# Patient Record
Sex: Female | Born: 1950 | Race: White | Hispanic: No | Marital: Single | Smoking: Never smoker
Health system: Southern US, Community
[De-identification: ages and names within clinical notes are randomized; demographics above are authoritative.]

## PROBLEM LIST (undated history)

## (undated) DIAGNOSIS — T4145XA Adverse effect of unspecified anesthetic, initial encounter: Secondary | ICD-10-CM

## (undated) DIAGNOSIS — R112 Nausea with vomiting, unspecified: Secondary | ICD-10-CM

## (undated) DIAGNOSIS — R9389 Abnormal findings on diagnostic imaging of other specified body structures: Secondary | ICD-10-CM

## (undated) DIAGNOSIS — Z9889 Other specified postprocedural states: Secondary | ICD-10-CM

## (undated) DIAGNOSIS — J189 Pneumonia, unspecified organism: Secondary | ICD-10-CM

## (undated) DIAGNOSIS — I219 Acute myocardial infarction, unspecified: Secondary | ICD-10-CM

## (undated) DIAGNOSIS — E785 Hyperlipidemia, unspecified: Secondary | ICD-10-CM

## (undated) DIAGNOSIS — T8859XA Other complications of anesthesia, initial encounter: Secondary | ICD-10-CM

## (undated) HISTORY — DX: Acute myocardial infarction, unspecified: I21.9

## (undated) HISTORY — PX: CARDIAC CATHETERIZATION: SHX172

## (undated) HISTORY — DX: Hyperlipidemia, unspecified: E78.5

---

## 2005-08-10 HISTORY — PX: TOTAL KNEE ARTHROPLASTY: SHX125

## 2005-11-17 ENCOUNTER — Inpatient Hospital Stay (HOSPITAL_COMMUNITY): Admission: RE | Admit: 2005-11-17 | Discharge: 2005-11-21 | Payer: Self-pay | Admitting: Specialist

## 2010-08-10 HISTORY — PX: OTHER SURGICAL HISTORY: SHX169

## 2011-10-27 ENCOUNTER — Institutional Professional Consult (permissible substitution): Payer: Self-pay | Admitting: Pulmonary Disease

## 2011-10-30 ENCOUNTER — Ambulatory Visit (INDEPENDENT_AMBULATORY_CARE_PROVIDER_SITE_OTHER): Payer: Commercial Managed Care - PPO | Admitting: Pulmonary Disease

## 2011-10-30 ENCOUNTER — Encounter: Payer: Self-pay | Admitting: Pulmonary Disease

## 2011-10-30 VITALS — BP 150/98 | HR 92 | Temp 98.0°F | Ht 67.0 in | Wt 195.6 lb

## 2011-10-30 DIAGNOSIS — J8409 Other alveolar and parieto-alveolar conditions: Secondary | ICD-10-CM

## 2011-10-30 DIAGNOSIS — R918 Other nonspecific abnormal finding of lung field: Secondary | ICD-10-CM

## 2011-10-30 NOTE — Patient Instructions (Addendum)
Will set up for bronchoscopy next week to evaluate your abnormal xray.   No aspirin within 72 hrs of procedure. Will arrange followup once results are available.

## 2011-10-30 NOTE — Assessment & Plan Note (Signed)
The patient has bilateral patchy infiltrates with air bronchograms of unknown origin.  Surprisingly, she really has no significant pulmonary symptoms, and feels well.  I have discussed the various possibilities with her, including atypical infections, inflammatory lung diseases, and also in the very remote possibility of cancer.  I suspect this is some type of inflammatory process such as eosinophilic pneumonia, hypersensitivity pneumonitis, or possibly bronchiolitis obliterans with organizing pneumonia.  I have explained to her that she needs bronchoscopy in order to obtain a diagnosis, and she is agreeable.  I discussed the risks of respiratory distress, bleeding, as well as pneumothorax.

## 2011-10-30 NOTE — Progress Notes (Signed)
  Subjective:    Patient ID: Chelsea Thornton, female    DOB: 1950/10/04, 61 y.o.   MRN: [id number]  HPI The patient is a 61 year old female who been asked to see for an abnormal CT chest.  The patient had a myocardial infarction in November of last year, and was found to have an abnormal chest x-ray suggestive of pneumonia.  She was treated with antibiotics, but had no change in her infiltrates.  The patient was very hesitant to have any further workup but ultimately agreed to a CT chest recently.  She was found to have bilateral patchy groundglass densities of unknown origin.  There was no lymphadenopathy noted.  Surprisingly, the patient denies any significant cough or chest congestion.  She does on rare occasions produces scant pale yellow mucous.  She denies any shortness of breath and feels that her exertional tolerance is at baseline.  She is eating well and has not been losing weight.  The patient denies any history of TB exposure, but has never had a PPD placed.  She has no personal history of cancer.  He denies any unusual hobbies, travel, or pets.  She has not been around fowl.     Review of Systems  Constitutional: Negative for fever and unexpected weight change.  HENT: Negative for ear pain, nosebleeds, congestion, sore throat, rhinorrhea, sneezing, trouble swallowing, dental problem, postnasal drip and sinus pressure.   Eyes: Negative for redness and itching.  Respiratory: Negative for cough, chest tightness, shortness of breath and wheezing.   Cardiovascular: Negative for palpitations and leg swelling.  Gastrointestinal: Negative for nausea and vomiting.  Genitourinary: Negative for dysuria.  Musculoskeletal: Negative for joint swelling.  Skin: Negative for rash.  Neurological: Negative for headaches.  Hematological: Does not bruise/bleed easily.  Psychiatric/Behavioral: Negative for dysphoric mood. The patient is not nervous/anxious.        Objective:   Physical  Exam Constitutional:  Well developed, no acute distress  HENT:  Nares patent without discharge  Oropharynx without exudate, palate and uvula are normal  Eyes:  Perrla, eomi, no scleral icterus  Neck:  No JVD, no TMG  Cardiovascular:  Normal rate, regular rhythm, no rubs or gallops.  No murmurs        Intact distal pulses  Pulmonary :  Normal breath sounds, no stridor or respiratory distress   No rales, rhonchi, or wheezing  Abdominal:  Soft, nondistended, bowel sounds present.  No tenderness noted.   Musculoskeletal:  1+ lower extremity edema noted.  Lymph Nodes:  No cervical lymphadenopathy noted  Skin:  No cyanosis noted  Neurologic:  Alert, appropriate, moves all 4 extremities without obvious deficit.         Assessment & Plan:

## 2011-11-03 ENCOUNTER — Ambulatory Visit (HOSPITAL_COMMUNITY): Payer: Commercial Managed Care - PPO

## 2011-11-03 ENCOUNTER — Ambulatory Visit (HOSPITAL_COMMUNITY)
Admission: RE | Admit: 2011-11-03 | Discharge: 2011-11-03 | Disposition: A | Discharge status: Home Health | Payer: Commercial Managed Care - PPO | Source: Ambulatory Visit | Attending: Pulmonary Disease | Admitting: Pulmonary Disease

## 2011-11-03 ENCOUNTER — Encounter (HOSPITAL_COMMUNITY)
Admission: RE | Disposition: A | Discharge status: Home Health | Payer: Self-pay | Source: Ambulatory Visit | Attending: Pulmonary Disease

## 2011-11-03 ENCOUNTER — Ambulatory Visit (HOSPITAL_COMMUNITY)
Admission: RE | Admit: 2011-11-03 | Discharge: 2011-11-03 | Disposition: A | Discharge status: Home / Self Care | Payer: Commercial Managed Care - PPO | Source: Ambulatory Visit | Attending: Pulmonary Disease | Admitting: Pulmonary Disease

## 2011-11-03 ENCOUNTER — Encounter (HOSPITAL_COMMUNITY): Payer: Self-pay

## 2011-11-03 DIAGNOSIS — R918 Other nonspecific abnormal finding of lung field: Secondary | ICD-10-CM | POA: Insufficient documentation

## 2011-11-03 DIAGNOSIS — I252 Old myocardial infarction: Secondary | ICD-10-CM | POA: Insufficient documentation

## 2011-11-03 HISTORY — PX: VIDEO BRONCHOSCOPY: SHX5072

## 2011-11-03 LAB — BODY FLUID CELL COUNT WITH DIFFERENTIAL: Monocyte-Macrophage-Serous Fluid: 0 % — ABNORMAL LOW (ref 50–90)

## 2011-11-03 SURGERY — VIDEO BRONCHOSCOPY WITHOUT FLUORO
Anesthesia: Moderate Sedation | Laterality: Bilateral

## 2011-11-03 MED ORDER — SODIUM CHLORIDE 0.9 % IV SOLN
INTRAVENOUS | Status: DC
  Administered 2011-11-03: 20 mL/h via INTRAVENOUS

## 2011-11-03 MED ORDER — LIDOCAINE HCL 2 % EX GEL
CUTANEOUS | Status: DC | PRN
  Administered 2011-11-03: 1

## 2011-11-03 MED ORDER — MIDAZOLAM HCL 10 MG/2ML IJ SOLN
INTRAMUSCULAR | Status: DC | PRN
  Administered 2011-11-03: 5 mg via INTRAVENOUS

## 2011-11-03 MED ORDER — MIDAZOLAM HCL 10 MG/2ML IJ SOLN
INTRAMUSCULAR | Status: AC
  Filled 2011-11-03: qty 4

## 2011-11-03 MED ORDER — MEPERIDINE HCL 25 MG/ML IJ SOLN
INTRAMUSCULAR | Status: DC | PRN
  Administered 2011-11-03: 50 mg via INTRAVENOUS

## 2011-11-03 MED ORDER — LIDOCAINE HCL 1 % IJ SOLN
INTRAMUSCULAR | Status: DC | PRN
  Administered 2011-11-03: 6 mL via RESPIRATORY_TRACT

## 2011-11-03 MED ORDER — PHENYLEPHRINE HCL 0.5 % NA SOLN
NASAL | Status: DC | PRN
  Administered 2011-11-03: 2 [drp] via NASAL

## 2011-11-03 MED ORDER — MEPERIDINE HCL 100 MG/ML IJ SOLN
INTRAMUSCULAR | Status: AC
  Filled 2011-11-03: qty 2

## 2011-11-03 NOTE — Discharge Instructions (Signed)
Bronchoscopy Care After These instructions give you information on caring for yourself after your procedure. Your doctor may also give you specific instructions. Call your doctor if you have any problems or questions after your procedure. HOME CARE  Do not eat or drink anything for 2 hours after your test. Your nose and throat was numbed by medicine. If you try to eat or drink before the medicine wears off, food or drink could go into your lungs.   For the rest of the first day, eat soft food and drink liquids slowly.   On the day after the test, you can go back to eating your usual food.   Do not drive or sign important papers the day of the test.   Take it easy for the next 2 days. Do not do any heavy work, exercise, or activities.   Only take medicine as told by your doctor. Do not take aspirin.   You may be drowsy for the next 24 hours.   You may see traces of blood in your spit for 1 to 2 days.  Finding out the results of your test Ask when your test results will be ready. Make sure you get your test results. GET HELP RIGHT AWAY IF:  You have breathing problems.   You have a bad sore throat for more than 1 week.   You see traces of blood in your spit for more than 3 days.   You start coughing up blood.   You have a temperature of 102 F (38.9 C) or higher.  MAKE SURE YOU:  Understand these instructions.   Will watch your condition.   Will get help right away if you are not doing well or get worse.  Document Released: 05/24/2009 Document Revised: 07/16/2011 Document Reviewed: 05/24/2009 [employer] Patient Information 2012 [employer]. 

## 2011-11-03 NOTE — Op Note (Signed)
Dictation # [id number]

## 2011-11-03 NOTE — Progress Notes (Signed)
Video bronchoscopy intervention with biopsy and BAL

## 2011-11-04 ENCOUNTER — Encounter (HOSPITAL_COMMUNITY): Payer: Self-pay | Admitting: Pulmonary Disease

## 2011-11-04 ENCOUNTER — Telehealth: Payer: Self-pay | Admitting: Pulmonary Disease

## 2011-11-04 NOTE — Telephone Encounter (Signed)
lmomtcb x1 

## 2011-11-04 NOTE — Op Note (Signed)
NAMEJANACE, Chelsea Thornton             ACCOUNT NO.:  [id number]  MEDICAL RECORD NO.:  [id number]  LOCATION:  RESP                         FACILITY[facility]  [facility]  PHYSICIAN:  Barbaraann Share, MD,FCCPDATE OF BIRTH:  07-02-1952  DATE OF PROCEDURE:  11/03/2011 DATE OF DISCHARGE:  11/03/2011                              OPERATIVE REPORT   PROCEDURE:  Video bronchoscopy with bronchoalveolar lavage and transbronchial lung biopsies.  OPERATOR:  Barbaraann Share, MD,FCCP.  INDICATION FOR THE PROCEDURE:  Bilateral pulmonary infiltrates of unknown origin.  ANESTHESIA:  Versed 5 mg IV, Demerol 50 mg IV, and topical 1% lidocaine to the vocal cords and airways during the procedure.  DESCRIPTION:  After obtaining informed consent and under close cardiopulmonary monitoring, the above preop anesthesia was given and the fiberoptic scope was passed to the right naris and into the posterior pharynx where there were no lesions or other abnormalities seen.  Vocal cords appeared to be within normal limits and moved bilaterally on phonation.  Scope was then passed into the trachea where it was examined along its entire length down to the level of the carina, all of which was normal.  The left and right tracheobronchial trees were examined to the subsegmental level with no endobronchial abnormality being found. Bronchoalveolar lavage was then done from the superior segment at the left lower lobe, lingula as well as the right lower lobe with good specimens being obtained.  Transbronchial lung biopsies were then done under fluoroscopic guidance from the basilar segments of the right lower lobe with excellent material being obtained.  Overall, the patient tolerated the procedure well and there were no complications.  Chest x- ray is pending at time of dictation to rule out pneumothorax post- transbronchial lung biopsy.     Barbaraann Share, MD,FCCP     [facility]/MED[facility]  D:  11/03/2011  T:  11/04/2011  Job:  [id number]

## 2011-11-04 NOTE — Interval H&P Note (Signed)
History and Physical Interval Note:  11/04/2011 8:52 AM  Chelsea Thornton  has presented today for surgery, with the diagnosis of Pulmonary infiltrate  The various methods of treatment have been discussed with the patient and family. After consideration of risks, benefits and other options for treatment, the patient has consented to  Procedure(s) (LRB): VIDEO BRONCHOSCOPY WITHOUT FLUORO (Bilateral) as a surgical intervention .  The patients' history has been reviewed, patient examined, no change in status, stable for surgery.  I have reviewed the patients' chart and labs.  Questions were answered to the patient's satisfaction.     Barbaraann Share

## 2011-11-04 NOTE — H&P (View-Only) (Signed)
  Subjective:    Patient ID: Chelsea Thornton, female    DOB: 03/15/1951, 61 y.o.   MRN: [id number]  HPI The patient is a 61-year-old female who been asked to see for an abnormal CT chest.  The patient had a myocardial infarction in November of last year, and was found to have an abnormal chest x-ray suggestive of pneumonia.  She was treated with antibiotics, but had no change in her infiltrates.  The patient was very hesitant to have any further workup but ultimately agreed to a CT chest recently.  She was found to have bilateral patchy groundglass densities of unknown origin.  There was no lymphadenopathy noted.  Surprisingly, the patient denies any significant cough or chest congestion.  She does on rare occasions produces scant pale yellow mucous.  She denies any shortness of breath and feels that her exertional tolerance is at baseline.  She is eating well and has not been losing weight.  The patient denies any history of TB exposure, but has never had a PPD placed.  She has no personal history of cancer.  He denies any unusual hobbies, travel, or pets.  She has not been around fowl.     Review of Systems  Constitutional: Negative for fever and unexpected weight change.  HENT: Negative for ear pain, nosebleeds, congestion, sore throat, rhinorrhea, sneezing, trouble swallowing, dental problem, postnasal drip and sinus pressure.   Eyes: Negative for redness and itching.  Respiratory: Negative for cough, chest tightness, shortness of breath and wheezing.   Cardiovascular: Negative for palpitations and leg swelling.  Gastrointestinal: Negative for nausea and vomiting.  Genitourinary: Negative for dysuria.  Musculoskeletal: Negative for joint swelling.  Skin: Negative for rash.  Neurological: Negative for headaches.  Hematological: Does not bruise/bleed easily.  Psychiatric/Behavioral: Negative for dysphoric mood. The patient is not nervous/anxious.        Objective:   Physical  Exam Constitutional:  Well developed, no acute distress  HENT:  Nares patent without discharge  Oropharynx without exudate, palate and uvula are normal  Eyes:  Perrla, eomi, no scleral icterus  Neck:  No JVD, no TMG  Cardiovascular:  Normal rate, regular rhythm, no rubs or gallops.  No murmurs        Intact distal pulses  Pulmonary :  Normal breath sounds, no stridor or respiratory distress   No rales, rhonchi, or wheezing  Abdominal:  Soft, nondistended, bowel sounds present.  No tenderness noted.   Musculoskeletal:  1+ lower extremity edema noted.  Lymph Nodes:  No cervical lymphadenopathy noted  Skin:  No cyanosis noted  Neurologic:  Alert, appropriate, moves all 4 extremities without obvious deficit.         Assessment & Plan:   

## 2011-11-04 NOTE — Telephone Encounter (Signed)
Spoke with pt and she states wants results of bronch. I advised they are not back yet, she just had test done yesterday. I advised per her pt instructions, that we will call her to arrange followup to discuss results once they are available. Pt verbalized understanding and states nothing further needed.

## 2011-11-05 ENCOUNTER — Telehealth: Payer: Self-pay | Admitting: Pulmonary Disease

## 2011-11-05 NOTE — Telephone Encounter (Signed)
ATC work # NA. I called house # and had to [facility]

## 2011-11-06 LAB — CULTURE, BAL-QUANTITATIVE W GRAM STAIN

## 2011-11-09 ENCOUNTER — Other Ambulatory Visit: Payer: Self-pay | Admitting: Pulmonary Disease

## 2011-11-09 DIAGNOSIS — J8409 Other alveolar and parieto-alveolar conditions: Secondary | ICD-10-CM

## 2011-11-09 LAB — LEGIONELLA CULTURE: Special Requests: NORMAL

## 2011-11-09 NOTE — Telephone Encounter (Signed)
Called, spoke with pt.  Would like to know if any results are back from bronch yet.  Per pt, her sister was told the latest she would get a call back regarding this would be today.  Dr. Shelle Iron, pls advise.  Thank you.

## 2011-11-09 NOTE — Telephone Encounter (Signed)
Tammy, these results DID NOT COME TO MY RESULTS BOX to be reviewed.  That includes path, cell count with differential., and others.  The only thing I saw last week was the prelim culture came to my box.

## 2011-11-10 ENCOUNTER — Telehealth: Payer: Self-pay | Admitting: Pulmonary Disease

## 2011-11-10 NOTE — Telephone Encounter (Signed)
Called and spoke with pt. Pt scheduled to see KC this thurs 4/4 at 10:15 am

## 2011-11-10 NOTE — Telephone Encounter (Signed)
I spoke with pt and she is requesting to see [facility] before she see's the surgeon. She states she just wants to discuss some things with him first. Please advise KC, thanks

## 2011-11-10 NOTE — Telephone Encounter (Signed)
Needs ov with me this week to discuss biopsy results.

## 2011-11-11 NOTE — Telephone Encounter (Signed)
PT has appt with Dr Shelle Iron on 11/12/11 to review biopsy results.

## 2011-11-12 ENCOUNTER — Telehealth: Payer: Self-pay | Admitting: Pulmonary Disease

## 2011-11-12 ENCOUNTER — Encounter: Payer: Commercial Managed Care - PPO | Admitting: Thoracic Surgery (Cardiothoracic Vascular Surgery)

## 2011-11-12 ENCOUNTER — Ambulatory Visit (INDEPENDENT_AMBULATORY_CARE_PROVIDER_SITE_OTHER): Payer: Commercial Managed Care - PPO | Admitting: Pulmonary Disease

## 2011-11-12 ENCOUNTER — Encounter: Payer: Self-pay | Admitting: Pulmonary Disease

## 2011-11-12 ENCOUNTER — Other Ambulatory Visit (INDEPENDENT_AMBULATORY_CARE_PROVIDER_SITE_OTHER): Payer: Commercial Managed Care - PPO

## 2011-11-12 VITALS — BP 176/100 | HR 90 | Temp 97.8°F | Ht 67.0 in | Wt 191.0 lb

## 2011-11-12 DIAGNOSIS — J8409 Other alveolar and parieto-alveolar conditions: Secondary | ICD-10-CM

## 2011-11-12 MED ORDER — PREDNISONE 20 MG PO TABS: ORAL_TABLET | ORAL | Status: DC

## 2011-11-12 NOTE — Assessment & Plan Note (Signed)
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  It is unclear whether this is an inflammatory process in the lung, or whether it may represent a parenchymal lymphoma?  Transbronchial lung biopsies are typically too small to make the diagnosis of lymphoma.  I have had a long discussion with the patient about her biopsy findings, and have strongly recommended a thoracoscopic biopsy for more definitive diagnosis.  The patient does not want to have a biopsy at this time.  One alternative plan would be to treat her with a course of prednisone and to see if she has significant improvement.  Unfortunately, many lymphomas can partially respond to steroids as well.  The patient is willing to have a lung biopsy if her infiltrates do not improve or if they only partially respond.  She understands the risk of treating her empirically with steroids.  I would like to check an autoimmune panel in the event this may represent BOOP.

## 2011-11-12 NOTE — Progress Notes (Signed)
  Subjective:    Patient ID: Chelsea Thornton, female    DOB: Jul 03, 1951, 61 y.o.   MRN: [id number]  HPI Patient comes in today for followup after her recent bronchoscopy for bilateral pulmonary infiltrates.  All of her cultures were unremarkable, as was her cell count and differential.  Her transbronchial lung biopsy showed prominent chronic lymphoplasmacytic inflammation with interstitial edema and abundant histiocytes.  There was no evidence for malignancy.  Special stains were unremarkable for yeast and fungi.  I have reviewed the biopsy results with her in detail, and cancer all of her questions.   Review of Systems  Constitutional: Negative for fever and unexpected weight change.  HENT: Negative for ear pain, nosebleeds, congestion, sore throat, rhinorrhea, sneezing, trouble swallowing, dental problem, postnasal drip and sinus pressure.   Eyes: Negative for redness and itching.  Respiratory: Negative for cough, chest tightness, shortness of breath and wheezing.   Cardiovascular: Negative for palpitations and leg swelling.  Gastrointestinal: Negative for nausea and vomiting.  Genitourinary: Negative for dysuria.  Musculoskeletal: Negative for joint swelling.  Skin: Negative for rash.  Neurological: Negative for headaches.  Hematological: Does not bruise/bleed easily.  Psychiatric/Behavioral: Negative for dysphoric mood. The patient is not nervous/anxious.        Objective:   Physical Exam Well-developed female in no acute distress Nose without purulence or discharge noted Chest with a few scattered crackles, no wheezes Cardiac exam is regular rate and rhythm Lower extremities without edema, no cyanosis Alert and oriented, moves all 4 extremities.       Assessment & Plan:

## 2011-11-12 NOTE — Telephone Encounter (Signed)
I spoke with pt and she stated that [facility] never received her rx for her prednisone. I advised it shows it was printed off and she stated she was never given rx. I have resent rx into the pharmacy and nothing further was needed

## 2011-11-12 NOTE — Patient Instructions (Signed)
Will check bloodwork today to evaluate for autoimmune diseases Will start on prednisone, and see you back in 2mos after a followup ct chest. Please call me if you change your mind and would like to consider surgery.

## 2011-11-13 ENCOUNTER — Telehealth: Payer: Self-pay | Admitting: Pulmonary Disease

## 2011-11-13 LAB — RHEUMATOID FACTOR: Rhuematoid fact SerPl-aCnc: <10 IU/mL (ref <=14)

## 2011-11-13 LAB — ANA: Anti Nuclear Antibody(ANA): POSITIVE — AB

## 2011-11-13 LAB — ANTI-NUCLEAR AB-TITER (ANA TITER): ANA Titer 1: NEGATIVE

## 2011-11-13 NOTE — Telephone Encounter (Signed)
I spoke with pt and she stated that the pharmacists had advised her that she could have some side effects from the prednisone and then their is a chance she wouldn't have any. I advised her this was correct and she voiced her understanding and needed nothing further

## 2011-11-13 NOTE — Telephone Encounter (Signed)
lmomtcb x1 for pt 

## 2011-11-13 NOTE — Telephone Encounter (Signed)
Pt returned call. Chelsea Thornton  

## 2011-11-17 ENCOUNTER — Telehealth: Payer: Self-pay | Admitting: Pulmonary Disease

## 2011-11-17 NOTE — Telephone Encounter (Signed)
Pt advised of results per lab append. Carron Curie, CMA

## 2011-12-01 LAB — FUNGUS CULTURE W SMEAR
Fungal Smear: NONE SEEN
Special Requests: NORMAL

## 2011-12-17 LAB — AFB CULTURE WITH SMEAR (NOT AT ARMC)
Acid Fast Smear: NONE SEEN
Special Requests: NORMAL

## 2012-01-15 ENCOUNTER — Ambulatory Visit (INDEPENDENT_AMBULATORY_CARE_PROVIDER_SITE_OTHER)
Admission: RE | Admit: 2012-01-15 | Discharge: 2012-01-15 | Disposition: A | Discharge status: Home / Self Care | Payer: Commercial Managed Care - PPO | Source: Ambulatory Visit | Attending: Pulmonary Disease | Admitting: Pulmonary Disease

## 2012-01-15 DIAGNOSIS — J8409 Other alveolar and parieto-alveolar conditions: Secondary | ICD-10-CM

## 2012-01-18 ENCOUNTER — Ambulatory Visit: Payer: Commercial Managed Care - PPO | Admitting: Pulmonary Disease

## 2012-01-19 ENCOUNTER — Encounter: Payer: Self-pay | Admitting: Pulmonary Disease

## 2012-01-19 ENCOUNTER — Ambulatory Visit (INDEPENDENT_AMBULATORY_CARE_PROVIDER_SITE_OTHER): Payer: Commercial Managed Care - PPO | Admitting: Pulmonary Disease

## 2012-01-19 VITALS — BP 124/80 | HR 84 | Temp 97.7°F | Ht 66.0 in | Wt 201.2 lb

## 2012-01-19 DIAGNOSIS — J8409 Other alveolar and parieto-alveolar conditions: Secondary | ICD-10-CM

## 2012-01-19 MED ORDER — PREDNISONE 10 MG PO TABS: ORAL_TABLET | ORAL | Status: DC

## 2012-01-19 NOTE — Assessment & Plan Note (Signed)
The patient has had a significant response both radiographically and symptomatically on the prednisone pulse.  However, she has not completely cleared her infiltrates, and I am concerned they will worsen again off prednisone.  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  I still feel that she needs a thoracoscopic lung biopsy, but she would like to give this more time to see if she responds further to steroids.  I am willing to give her another month, and then discontinue the prednisone.  If she has worsening of her infiltrates off the medication, she is agreeable to having a lung biopsy.

## 2012-01-19 NOTE — Patient Instructions (Signed)
Will treat with prednisone for the next 4 weeks.  If your abnormalities return or fail to clear, will send for lung biopsy. Take prednisone 20mg  each day for one week, then take 15mg  each day for one week, then take 10mg  each day for one week, then take 5mg  each day for one week, then stop. followup with me in 3 mos with cxr same day, but call me if your pulmonary symptoms worsen off prednisone.

## 2012-01-19 NOTE — Progress Notes (Signed)
  Subjective:    Patient ID: Chelsea Thornton, female    DOB: 1951-06-26, 61 y.o.   MRN: [id number]  HPI The patient comes in today for followup of her pulmonary infiltrates of unknown origin.  She was started on a course of prednisone at the last visit, and did not want to go for thoracoscopic biopsy.  She has had blood work done which is unremarkable except for an elevated sedimentation rate in the moderate range.  Her rheumatoid factor and ANA were all unremarkable.  The patient has had a followup chest CT after being treated with steroids, and she has had at least an 80% improvement although there is still residual.  At the start of this, she felt that she was asymptomatic, but clearly noticed an improvement in her cough, mucus, and breathing since being on the steroids.   Review of Systems  Constitutional: Negative.  Negative for fever and unexpected weight change.  HENT: Negative.  Negative for ear pain, nosebleeds, congestion, sore throat, rhinorrhea, sneezing, trouble swallowing, dental problem, postnasal drip and sinus pressure.   Eyes: Negative.  Negative for redness and itching.  Respiratory: Negative.  Negative for cough, chest tightness, shortness of breath and wheezing.   Cardiovascular: Negative.  Negative for palpitations and leg swelling.  Gastrointestinal: Negative.  Negative for nausea and vomiting.  Genitourinary: Negative.  Negative for dysuria.  Musculoskeletal: Negative.  Negative for joint swelling.  Skin: Negative.  Negative for rash.  Neurological: Negative.  Negative for headaches.  Hematological: Does not bruise/bleed easily.  Psychiatric/Behavioral: Negative.  Negative for dysphoric mood. The patient is not nervous/anxious.        Objective:   Physical Exam Overweight female in no acute distress Nose without purulence or discharge noted Chest with minimal basilar crackles, no wheezing Cardiac exam with regular rate and rhythm Lower extremities with 1+ edema  bilaterally, no cyanosis Alert and oriented, moves all 4 extremities.       Assessment & Plan:

## 2012-04-20 ENCOUNTER — Encounter: Payer: Self-pay | Admitting: Pulmonary Disease

## 2012-04-20 ENCOUNTER — Ambulatory Visit (INDEPENDENT_AMBULATORY_CARE_PROVIDER_SITE_OTHER): Payer: Commercial Managed Care - PPO | Admitting: Pulmonary Disease

## 2012-04-20 ENCOUNTER — Ambulatory Visit (INDEPENDENT_AMBULATORY_CARE_PROVIDER_SITE_OTHER)
Admission: RE | Admit: 2012-04-20 | Discharge: 2012-04-20 | Disposition: A | Discharge status: Home / Self Care | Payer: Commercial Managed Care - PPO | Source: Ambulatory Visit | Attending: Pulmonary Disease | Admitting: Pulmonary Disease

## 2012-04-20 VITALS — BP 122/80 | HR 86 | Temp 98.0°F | Ht 66.0 in | Wt 208.0 lb

## 2012-04-20 DIAGNOSIS — J8409 Other alveolar and parieto-alveolar conditions: Secondary | ICD-10-CM

## 2012-04-20 NOTE — Assessment & Plan Note (Signed)
The patient has seen significant improvement in her bilateral infiltrates and pulmonary symptoms after a course of prednisone.  She has been off the medication for 2 months now, with no recurrence of her symptoms.  Her chest x-ray shows definite improvement in her infiltrates.  I have tried very hard to get her to consider a thoracoscopic biopsy, however she wishes to see how she does off the medication.  She is willing to have the biopsy it she has recurrence of her symptoms or worsening infiltrates.

## 2012-04-20 NOTE — Progress Notes (Signed)
  Subjective:    Patient ID: Chelsea Thornton, female    DOB: May 29, 1951, 60 y.o.   MRN: [id number]  HPI Patient comes in today for followup of her bilateral pulmonary infiltrates.  She's been treated with a short course of prednisone with significant improvement in her symptoms, as well as partial clearing of her CT chest at the last visit.  She has been off prednisone now for 2 months, and is seen no recurrence in her prior symptoms.  She denies any cough, congestion, or increased shortness of breath.  Her chest x-ray today shows persistent lower lobe abnormal densities, but definitely improved from prior films.   Review of Systems  Constitutional: Negative for fever and unexpected weight change.  HENT: Positive for rhinorrhea and sneezing. Negative for ear pain, nosebleeds, congestion, sore throat, trouble swallowing, dental problem, postnasal drip and sinus pressure.   Eyes: Positive for redness and itching.  Respiratory: Negative for cough, chest tightness, shortness of breath and wheezing.   Cardiovascular: Positive for leg swelling. Negative for palpitations.  Gastrointestinal: Negative for nausea and vomiting.  Genitourinary: Negative for dysuria.  Musculoskeletal: Negative for joint swelling.  Skin: Negative for rash.  Neurological: Negative for headaches.  Hematological: Bruises/bleeds easily.  Psychiatric/Behavioral: Negative for dysphoric mood. The patient is not nervous/anxious.        Objective:   Physical Exam Overweight female in no acute distress Nose without purulence or discharge noted Chest with a few faint basilar crackles, otherwise clear Cardiac exam with regular rate and rhythm Lower extremities with 2+ edema and varicosities, no cyanosis noted Alert and oriented, moves all 4 extremities.       Assessment & Plan:

## 2012-04-20 NOTE — Patient Instructions (Addendum)
Will continue to monitor you off prednisone to see how things go.  Please call if any change in symptoms. followup with me in 6mos, and will check cxr at next visit.

## 2012-09-15 ENCOUNTER — Ambulatory Visit (HOSPITAL_COMMUNITY)
Admission: RE | Admit: 2012-09-15 | Discharge: 2012-09-15 | Disposition: A | Discharge status: Home / Self Care | Payer: Commercial Managed Care - PPO | Source: Ambulatory Visit | Attending: Cardiovascular Disease | Admitting: Cardiovascular Disease

## 2012-09-15 ENCOUNTER — Other Ambulatory Visit (HOSPITAL_COMMUNITY): Payer: Self-pay | Admitting: *Deleted

## 2012-09-15 ENCOUNTER — Other Ambulatory Visit (HOSPITAL_COMMUNITY): Payer: Self-pay | Admitting: Specialist

## 2012-09-15 DIAGNOSIS — M7989 Other specified soft tissue disorders: Secondary | ICD-10-CM

## 2012-09-15 NOTE — Progress Notes (Signed)
Lower Extremity Venous Duplex Limited Maxwell Caul

## 2012-09-16 ENCOUNTER — Ambulatory Visit
Admission: RE | Admit: 2012-09-16 | Discharge: 2012-09-16 | Disposition: A | Discharge status: Home / Self Care | Payer: Commercial Managed Care - PPO | Source: Ambulatory Visit | Attending: Specialist | Admitting: Specialist

## 2012-09-16 ENCOUNTER — Other Ambulatory Visit: Payer: Self-pay | Admitting: Specialist

## 2012-09-16 DIAGNOSIS — IMO0002 Reserved for concepts with insufficient information to code with codable children: Secondary | ICD-10-CM

## 2012-09-16 NOTE — Progress Notes (Deleted)
Carotid Duplex Completed. Chelsea Thornton- 

## 2012-09-16 NOTE — Progress Notes (Signed)
Left Lower Ext. Venous Duplex Completed. No Evidence of DVT. Maxwell Caul

## 2012-09-23 ENCOUNTER — Encounter (HOSPITAL_COMMUNITY): Payer: Self-pay

## 2012-09-23 ENCOUNTER — Inpatient Hospital Stay (HOSPITAL_COMMUNITY)
Admission: AD | Admit: 2012-09-23 | Discharge: 2012-09-29 | DRG: 492 | Disposition: A | Discharge status: Skilled Nursing Facility | Payer: Commercial Managed Care - PPO | Source: Other Acute Inpatient Hospital | Attending: Internal Medicine | Admitting: Internal Medicine

## 2012-09-23 DIAGNOSIS — J8409 Other alveolar and parieto-alveolar conditions: Secondary | ICD-10-CM | POA: Diagnosis present

## 2012-09-23 DIAGNOSIS — R9389 Abnormal findings on diagnostic imaging of other specified body structures: Secondary | ICD-10-CM

## 2012-09-23 DIAGNOSIS — D72829 Elevated white blood cell count, unspecified: Secondary | ICD-10-CM | POA: Diagnosis present

## 2012-09-23 DIAGNOSIS — M949 Disorder of cartilage, unspecified: Secondary | ICD-10-CM | POA: Diagnosis present

## 2012-09-23 DIAGNOSIS — I10 Essential (primary) hypertension: Secondary | ICD-10-CM

## 2012-09-23 DIAGNOSIS — E785 Hyperlipidemia, unspecified: Secondary | ICD-10-CM | POA: Diagnosis present

## 2012-09-23 DIAGNOSIS — S82899A Other fracture of unspecified lower leg, initial encounter for closed fracture: Secondary | ICD-10-CM

## 2012-09-23 DIAGNOSIS — S99919A Unspecified injury of unspecified ankle, initial encounter: Secondary | ICD-10-CM

## 2012-09-23 DIAGNOSIS — IMO0002 Reserved for concepts with insufficient information to code with codable children: Secondary | ICD-10-CM

## 2012-09-23 DIAGNOSIS — W19XXXA Unspecified fall, initial encounter: Secondary | ICD-10-CM | POA: Diagnosis present

## 2012-09-23 DIAGNOSIS — K5900 Constipation, unspecified: Secondary | ICD-10-CM | POA: Diagnosis present

## 2012-09-23 DIAGNOSIS — I252 Old myocardial infarction: Secondary | ICD-10-CM

## 2012-09-23 DIAGNOSIS — E119 Type 2 diabetes mellitus without complications: Secondary | ICD-10-CM | POA: Diagnosis present

## 2012-09-23 DIAGNOSIS — M899 Disorder of bone, unspecified: Secondary | ICD-10-CM | POA: Diagnosis present

## 2012-09-23 DIAGNOSIS — J9601 Acute respiratory failure with hypoxia: Secondary | ICD-10-CM | POA: Diagnosis present

## 2012-09-23 DIAGNOSIS — R0902 Hypoxemia: Secondary | ICD-10-CM | POA: Diagnosis present

## 2012-09-23 DIAGNOSIS — D638 Anemia in other chronic diseases classified elsewhere: Secondary | ICD-10-CM | POA: Diagnosis present

## 2012-09-23 DIAGNOSIS — D649 Anemia, unspecified: Secondary | ICD-10-CM | POA: Diagnosis present

## 2012-09-23 DIAGNOSIS — R339 Retention of urine, unspecified: Secondary | ICD-10-CM | POA: Diagnosis present

## 2012-09-23 DIAGNOSIS — S8990XA Unspecified injury of unspecified lower leg, initial encounter: Secondary | ICD-10-CM

## 2012-09-23 DIAGNOSIS — J189 Pneumonia, unspecified organism: Secondary | ICD-10-CM | POA: Insufficient documentation

## 2012-09-23 DIAGNOSIS — S82109A Unspecified fracture of upper end of unspecified tibia, initial encounter for closed fracture: Principal | ICD-10-CM | POA: Diagnosis present

## 2012-09-23 DIAGNOSIS — J9600 Acute respiratory failure, unspecified whether with hypoxia or hypercapnia: Secondary | ICD-10-CM | POA: Diagnosis present

## 2012-09-23 DIAGNOSIS — S82202A Unspecified fracture of shaft of left tibia, initial encounter for closed fracture: Secondary | ICD-10-CM

## 2012-09-23 DIAGNOSIS — E1110 Type 2 diabetes mellitus with ketoacidosis without coma: Secondary | ICD-10-CM

## 2012-09-23 HISTORY — DX: Pneumonia, unspecified organism: J18.9

## 2012-09-23 HISTORY — DX: Abnormal findings on diagnostic imaging of other specified body structures: R93.89

## 2012-09-23 MED ORDER — ONDANSETRON HCL 4 MG/2ML IJ SOLN: 4.0000 mg | Freq: Four times a day (QID) | INTRAMUSCULAR | Status: DC | PRN

## 2012-09-23 MED ORDER — INSULIN ASPART 100 UNIT/ML ~~LOC~~ SOLN
0.0000 [IU] | Freq: Three times a day (TID) | SUBCUTANEOUS | Status: DC
  Administered 2012-09-24: 2 [IU] via SUBCUTANEOUS

## 2012-09-23 MED ORDER — SODIUM CHLORIDE 0.9 % IJ SOLN
3.0000 mL | Freq: Two times a day (BID) | INTRAMUSCULAR | Status: DC
  Administered 2012-09-24 – 2012-09-28 (×6): 3 mL via INTRAVENOUS

## 2012-09-23 MED ORDER — ENOXAPARIN SODIUM 60 MG/0.6ML ~~LOC~~ SOLN
50.0000 mg | Freq: Every day | SUBCUTANEOUS | Status: DC
  Administered 2012-09-24 (×2): 50 mg via SUBCUTANEOUS
  Filled 2012-09-23 (×3): qty 0.6

## 2012-09-23 MED ORDER — MORPHINE SULFATE 2 MG/ML IJ SOLN
1.0000 mg | INTRAMUSCULAR | Status: DC | PRN
  Administered 2012-09-24 – 2012-09-26 (×5): 1 mg via INTRAVENOUS
  Filled 2012-09-23 (×5): qty 1

## 2012-09-23 MED ORDER — ONDANSETRON HCL 4 MG PO TABS: 4.0000 mg | ORAL_TABLET | Freq: Four times a day (QID) | ORAL | Status: DC | PRN

## 2012-09-23 MED ORDER — LEVOFLOXACIN 500 MG PO TABS
500.0000 mg | ORAL_TABLET | Freq: Every day | ORAL | Status: DC
  Administered 2012-09-24: 500 mg via ORAL
  Filled 2012-09-23 (×2): qty 1

## 2012-09-23 MED ORDER — SODIUM CHLORIDE 0.9 % IV SOLN: 250.0000 mL | INTRAVENOUS | Status: DC | PRN

## 2012-09-23 MED ORDER — SODIUM CHLORIDE 0.9 % IJ SOLN: 3.0000 mL | INTRAMUSCULAR | Status: DC | PRN

## 2012-09-23 NOTE — H&P (Signed)
  PCP:   [name], MD   Chief Complaint:  Transferred from [place] for ortho surgery  HPI: 62 yo female who recently fell and suffered from a tib/fib plateur fracture.  Was at home and fell and presented to [facility] and hospitalized for inability to get around due to the fracture.  They were going to operate last week but patient refused and wished to be transferred here to have the surgery.  She was dx with pna at [place] and put on azithro and rocephin.  There are minimal records sent from [place], including no discharge summary or MAR.  Info is very limited.  Dr Thomasena Edis who is pt routine ortho surgeon agreed to do surgery here at [facility] long so pt was transferred for that reason.  Review of Systems:  Positive and negative as per HPI otherwise all other systems are negative  Past Medical History: Past Medical History  Diagnosis Date  . Heart attack   . Diabetes mellitus   . Hyperlipidemia    Past Surgical History  Procedure Laterality Date  . Total knee arthroplasty  2007    Right  . Video bronchoscopy  11/03/2011    Procedure: VIDEO BRONCHOSCOPY WITHOUT FLUORO;  Surgeon: Barbaraann Share, MD;  Location: [facility];  Service: Cardiopulmonary;  Laterality: Bilateral;    Medications: Prior to Admission medications   Medication Sig Start Date End Date Taking? Authorizing Provider  cetirizine (ZYRTEC) 10 MG tablet Take 10 mg by mouth daily.    Historical Provider, MD  fluticasone (FLONASE) 50 MCG/ACT nasal spray 2 sprays in each nostril daily as needed 12/02/11   Historical Provider, MD  glimepiride (AMARYL) 2 MG tablet Take 1 tablet by mouth daily. 10/05/11   Historical Provider, MD  TRUETEST TEST test strip  03/12/12   Historical Provider, MD    Allergies:   Allergies  Allergen Reactions  . Nutritional Supplements   . Prednisone   . Shrimp (Shellfish Allergy)     Social History: neg  Family History: neg  Physical Exam: Filed Vitals:   09/23/12 2108  BP:  152/76  Pulse: 85  Temp: 99 F (37.2 C)  TempSrc: Oral  Resp: 20  SpO2: 92%   General appearance: alert, cooperative and no distress Neck: no JVD and supple, symmetrical, trachea midline Lungs: clear to auscultation bilaterally Heart: regular rate and rhythm, S1, S2 normal, no murmur, click, rub or gallop Abdomen: soft, non-tender; bowel sounds normal; no masses,  no organomegaly Extremities: extremities normal, atraumatic, no cyanosis or edema Pulses: 2+ and symmetric Skin: Skin color, texture, turgor normal. No rashes or lesions Neurologic: Grossly normal    Labs on Admission:  none   Radiological Exams on Admission: none  Assessment/Plan  62 yo female with tib/fib fracture left Principal Problem:   Knee injury Active Problems:   Acute respiratory failure with hypoxia   DM (diabetes mellitus)   PNA (pneumonia)  RN is calling  for complete medical records including discharge summary.  She has been on rocphein/azithro, will swtich to levaquin.  Ck baseline labs and cxr.  vss at this time.  Notify dr Thomasena Edis of pt arrival.  Ssi.    Latash Nouri A 09/23/2012, 10:50 PM

## 2012-09-23 NOTE — Progress Notes (Signed)
Received a called from [facility] requesting to accept patient on transfer for orthopedic surgery; Dr. Thomasena Edis in agreement to see and operate patient in consultation; had knee fracture. Admitted due to acute resp failure secondary to PNA. Improving already. Had also PMH of HTN, DM and HLD. Accepted to regular floor (normal vital signs and 95% RA O2 sat at this moment); team 10 and Dr. Rito Ehrlich.  Of note, Patient still with some desaturation into 88% on RA with minimal activity; reason why she has not been discharged. Also due to acute fracture not able to do much physically.  Pennelope Basque [phone]

## 2012-09-24 ENCOUNTER — Observation Stay (HOSPITAL_COMMUNITY): Payer: Commercial Managed Care - PPO

## 2012-09-24 ENCOUNTER — Other Ambulatory Visit (HOSPITAL_COMMUNITY): Payer: Commercial Managed Care - PPO

## 2012-09-24 ENCOUNTER — Encounter (HOSPITAL_COMMUNITY): Payer: Self-pay | Admitting: Internal Medicine

## 2012-09-24 DIAGNOSIS — R9389 Abnormal findings on diagnostic imaging of other specified body structures: Secondary | ICD-10-CM

## 2012-09-24 DIAGNOSIS — D649 Anemia, unspecified: Secondary | ICD-10-CM

## 2012-09-24 DIAGNOSIS — R918 Other nonspecific abnormal finding of lung field: Secondary | ICD-10-CM

## 2012-09-24 DIAGNOSIS — S82202A Unspecified fracture of shaft of left tibia, initial encounter for closed fracture: Secondary | ICD-10-CM

## 2012-09-24 DIAGNOSIS — R339 Retention of urine, unspecified: Secondary | ICD-10-CM | POA: Diagnosis present

## 2012-09-24 HISTORY — DX: Abnormal findings on diagnostic imaging of other specified body structures: R93.89

## 2012-09-24 LAB — GLUCOSE, CAPILLARY
Glucose-Capillary: 178 mg/dL — ABNORMAL HIGH (ref 70–99)
Glucose-Capillary: 220 mg/dL — ABNORMAL HIGH (ref 70–99)

## 2012-09-24 LAB — CBC
HCT: 29.7 % — ABNORMAL LOW (ref 36.0–46.0)
Hemoglobin: 10 g/dL — ABNORMAL LOW (ref 12.0–15.0)
MCV: 82.7 fL (ref 78.0–100.0)
RDW: 12.1 % (ref 11.5–15.5)
WBC: 8.4 10*3/uL (ref 4.0–10.5)

## 2012-09-24 LAB — BASIC METABOLIC PANEL
BUN: 7 mg/dL (ref 6–23)
CO2: 29 mEq/L (ref 19–32)
Chloride: 98 mEq/L (ref 96–112)
Creatinine, Ser: 0.37 mg/dL — ABNORMAL LOW (ref 0.50–1.10)
GFR calc Af Amer: >90 mL/min (ref >90)
Glucose, Bld: 226 mg/dL — ABNORMAL HIGH (ref 70–99)
Potassium: 3.8 mEq/L (ref 3.5–5.1)

## 2012-09-24 LAB — PROTIME-INR: INR: 1.01 (ref 0.00–1.49)

## 2012-09-24 MED ORDER — OXYCODONE HCL 5 MG PO TABS
5.0000 mg | ORAL_TABLET | ORAL | Status: DC | PRN
  Administered 2012-09-24 – 2012-09-26 (×6): 5 mg via ORAL
  Filled 2012-09-24: qty 2
  Filled 2012-09-24 (×3): qty 1
  Filled 2012-09-24: qty 2
  Filled 2012-09-24: qty 1

## 2012-09-24 MED ORDER — TRAMADOL HCL 50 MG PO TABS
50.0000 mg | ORAL_TABLET | Freq: Four times a day (QID) | ORAL | Status: DC | PRN
  Administered 2012-09-26: 50 mg via ORAL
  Filled 2012-09-24: qty 1

## 2012-09-24 MED ORDER — LEVOFLOXACIN IN D5W 750 MG/150ML IV SOLN
750.0000 mg | INTRAVENOUS | Status: DC
  Filled 2012-09-24: qty 150

## 2012-09-24 MED ORDER — INSULIN ASPART 100 UNIT/ML ~~LOC~~ SOLN
0.0000 [IU] | Freq: Three times a day (TID) | SUBCUTANEOUS | Status: DC
  Administered 2012-09-24 – 2012-09-26 (×3): 5 [IU] via SUBCUTANEOUS
  Administered 2012-09-26 – 2012-09-27 (×3): 3 [IU] via SUBCUTANEOUS
  Administered 2012-09-27: 5 [IU] via SUBCUTANEOUS
  Administered 2012-09-27: 3 [IU] via SUBCUTANEOUS
  Administered 2012-09-28 (×3): 5 [IU] via SUBCUTANEOUS
  Administered 2012-09-29: 3 [IU] via SUBCUTANEOUS

## 2012-09-24 MED ORDER — DOCUSATE SODIUM 100 MG PO CAPS
100.0000 mg | ORAL_CAPSULE | Freq: Two times a day (BID) | ORAL | Status: DC
  Administered 2012-09-24 – 2012-09-27 (×6): 100 mg via ORAL

## 2012-09-24 MED ORDER — INSULIN GLARGINE 100 UNIT/ML ~~LOC~~ SOLN
5.0000 [IU] | Freq: Every day | SUBCUTANEOUS | Status: DC
  Administered 2012-09-24: 5 [IU] via SUBCUTANEOUS
  Administered 2012-09-25: 22:00:00 via SUBCUTANEOUS
  Administered 2012-09-26: 5 [IU] via SUBCUTANEOUS

## 2012-09-24 NOTE — Progress Notes (Signed)
Pt reports not being able to void, bladder scan reveals > 999 ml of urine, notified the on call Elray Mcgregor, NP. New order given to insert a foley. Using sterile procedure  placed 14 french foley in and obtained 1050 ml of urine output at the time of insertion, pt tolerated well. No  Problem reported,

## 2012-09-24 NOTE — Progress Notes (Signed)
[facility] PROGRESS NOTE  Chelsea Thornton ZOX:[id number] DOB: 01-13-1951 DOA: 09/23/2012 PCP: [name], MD  Assessment/Plan:  #1 tibial plateau fracture Per CT scan of 09/16/2012 patient with a fracture extending from the lateral tibial eminence through the medial tibial metaphysis with minimal impaction along the medial tibial metaphysis. Associated joint effusion noted. Patient was transferred from [facility] for repair of fracture per her orthopod Dr. Thomasena Edis. Patient with swelling in the left knee. Will inform Dr. Thomasena Edis of patient's admission. Pain management. Orthopedic consult pending.  #2 abnormal chest x-ray Patient was being treated around the hospital for community acquired pneumonia secondary to abnormal chest x-ray. Patient denies any respiratory symptoms. Patient denies any fever, no chills, no cough, no congestion. Patient is being followed by Dr. Shelle Iron of [place] pulmonary secondary to abnormal chest x-ray findings which have been chronic. Patient was treated with a short course of prednisone with symptomatic improvement and partial clearing per CT per Dr. Teddy Spike note of 04/20/2012. Was recommended that patient be monitored off steroids with close followup with Dr. Shelle Iron as outpatient. Patient states she's been asymptomatic and is to see Dr. Shelle Iron in March for further evaluation. Patient has been treated with 5 days of antibiotics at [facility]. Will discontinue antibiotics at this time. Patient will need to followup as outpatient with Dr. Shelle Iron.  #3 diabetes mellitus Hemoglobin A1c is 9.8. CBGs 178. Hold oral hypoglycemic agents. Change sliding scale to moderate scale. Follow.  #4 urinary retention Continue Foley catheter. Follow.  #5 anemia No overt GI bleed. Check an anemia panel. Follow H&H.  #6 prophylaxis Lovenox for DVT prophylaxis.  Code Status: Full Family Communication: Updated patient at bedside. No family present. Disposition Plan:  Home vs SNF when medically stable   Consultants:  Orthopedic pending  Procedures:  Chest x-ray 09/24/2012  Antibiotics:  Levaquin 09/23/2012 to 09/24/2012  HPI/Subjective: Patient denies any chest pain. Patient denies any shortness of breath. Patient denies any cough, no fever, no chills, no other associated symptoms. Patient complaining of left knee pain and inability to bear weight. Patient states she never had respiratory symptoms and was placed on antibiotics around the hospital secondary to abnormal chest x-ray.  Objective: Filed Vitals:   09/23/12 2108 09/24/12 0626  BP: 152/76 144/60  Pulse: 85 79  Temp: 99 F (37.2 C) 98.5 F (36.9 C)  TempSrc: Oral Oral  Resp: 20 18  Height: 5\' 6"  (1.676 m)   Weight: 95.255 kg (210 lb)   SpO2: 92% 95%    Intake/Output Summary (Last 24 hours) at 09/24/12 0938 Last data filed at 09/24/12 0519  Gross per 24 hour  Intake      0 ml  Output    400 ml  Net   -400 ml   Filed Weights   09/23/12 2108  Weight: 95.255 kg (210 lb)    Exam:   General:  NAD  Cardiovascular: RRR, no m/r/g  Respiratory: Clear to auscultation bilaterally. No wheezes, no crackles, no rhonchi.  Abdomen: Soft, nontender, nondistended, positive bowel sounds.  Extremities: No clubbing cyanosis or edema. Left knee swelling. Left lateral knee tender to palpation. No warmth.  Data Reviewed: Basic Metabolic Panel:  Recent Labs Lab 09/24/12 0045  NA 134*  K 3.8  CL 98  CO2 29  GLUCOSE 226*  BUN 7  CREATININE 0.37*  CALCIUM 8.1*   Liver Function Tests: No results found for this basename: AST, ALT, ALKPHOS, BILITOT, PROT, ALBUMIN,  in the last 168 hours No results found for  this basename: LIPASE, AMYLASE,  in the last 168 hours No results found for this basename: AMMONIA,  in the last 168 hours CBC:  Recent Labs Lab 09/24/12 0045  WBC 8.4  HGB 10.0*  HCT 29.7*  MCV 82.7  PLT 474*   Cardiac Enzymes: No results found for this basename:  CKTOTAL, CKMB, CKMBINDEX, TROPONINI,  in the last 168 hours BNP (last 3 results) No results found for this basename: PROBNP,  in the last 8760 hours CBG:  Recent Labs Lab 09/24/12 0741  GLUCAP 178*    No results found for this or any previous visit (from the past 240 hour(s)).   Studies: X-ray Chest Pa And Lateral   09/24/2012  *RADIOLOGY REPORT*  Clinical Data: Evaluate for pneumonia.  CHEST - 2 VIEW  Comparison: Chest x-ray 09/23/2012.  Findings: Again noted are several mass-like areas of airspace consolidation in the mid and lower lungs bilaterally which appear similar to the recent prior examination.  Small bilateral pleural effusions.  No evidence of pulmonary edema.  Heart size is within normal limits.  Upper mediastinal contours are unremarkable. Atherosclerosis in the thoracic aorta.  IMPRESSION: 1.  Multifocal mass-like areas of airspace consolidation in the mid- to-lower lungs bilaterally may reflect multilobar pneumonia, however, this may alternatively reflecting not infectious process such as cryptogenic organizing pneumonia (COP).  Clinical correlation is recommended. 2.  Atherosclerosis.   Original Report Authenticated By: Trudie Reed, M.D.     Scheduled Meds: . enoxaparin (LOVENOX) injection  50 mg Subcutaneous QHS  . insulin aspart  0-15 Units Subcutaneous TID WC  . sodium chloride  3 mL Intravenous Q12H   Continuous Infusions:   Principal Problem:   Knee injury Active Problems:   Alveolar pneumopathy   Acute respiratory failure with hypoxia   DM (diabetes mellitus)   Urinary retention   Anemia   Abnormal CXR    Time spent: > 35 mins    [facility]  [facility] Pager [phone]. If 8PM-8AM, please contact night-coverage at [email or web address], password [facility] 09/24/2012, 9:38 AM  LOS: 1 day

## 2012-09-24 NOTE — Anesthesia Preprocedure Evaluation (Addendum)
Anesthesia Evaluation  Patient identified by MRN, date of birth, ID band Patient awake    Reviewed: Allergy & Precautions, H&P , NPO status , Patient's Chart, lab work & pertinent test results  Airway Mallampati: II TM Distance: >3 FB Neck ROM: full    Dental no notable dental hx.    Pulmonary pneumonia -,  Hx. Acute respiratory failure breath sounds clear to auscultation  Pulmonary exam normal       Cardiovascular + Past MI and + Cardiac Stents Rhythm:regular Rate:Normal  MI and stent 2012.  OK since.   Neuro/Psych negative neurological ROS  negative psych ROS   GI/Hepatic negative GI ROS, Neg liver ROS,   Endo/Other  diabetes, Well Controlled, Type 2, Oral Hypoglycemic Agents  Renal/GU negative Renal ROS  negative genitourinary   Musculoskeletal   Abdominal   Peds  Hematology negative hematology ROS (+) anemia , Hgb 10.5   Anesthesia Other Findings   Reproductive/Obstetrics negative OB ROS                         Anesthesia Physical Anesthesia Plan  ASA: III  Anesthesia Plan: General   Post-op Pain Management:    Induction: Intravenous  Airway Management Planned: Oral ETT  Additional Equipment:   Intra-op Plan:   Post-operative Plan: Extubation in OR  Informed Consent: I have reviewed the patients History and Physical, chart, labs and discussed the procedure including the risks, benefits and alternatives for the proposed anesthesia with the patient or authorized representative who has indicated his/her understanding and acceptance.   Dental Advisory Given  Plan Discussed with: CRNA and Surgeon  Anesthesia Plan Comments:         Anesthesia Quick Evaluation

## 2012-09-24 NOTE — Consult Note (Addendum)
Reason for Consult:  Left knee injury Referring Physician: Dr. Sheryle Spray Chelsea Thornton is an 62 y.o. female.  HPI: 62 y/o female with PMH of pneumonia, DM and CAD c/o L knee pain for several weeks.  She had a fall a few weeks ago and saw Dr. Thomasena Edis in clinic.  A CT on 2/7 showed a nondisplaced tibial plateau fracture.  She was being managed with NWB closed treatment when she fell again 5 days ago.  She has had more severe pain since then.  She was admitted to [facility], and a CT scan was obtained showing a displaced fracture of the tibial plateau involving the medial articular surface.  She c/o dull aching pain that is moderate in severity at rest and more severe and sharp with moving the knee.  She denies any h/o injury or surgery to the left knee in the past.  She is not a smoker but is diabetic.  She was transferred from [facility] at her request so Dr. Thomasena Edis could treat her for this injury.  Past Medical History  Diagnosis Date  . Heart attack   . Diabetes mellitus   . Hyperlipidemia   . Pneumonia   . Abnormal CXR 09/24/2012    Past Surgical History  Procedure Laterality Date  . Total knee arthroplasty  2007    Right  . Video bronchoscopy  11/03/2011    Procedure: VIDEO BRONCHOSCOPY WITHOUT FLUORO;  Surgeon: Barbaraann Share, MD;  Location: [facility];  Service: Cardiopulmonary;  Laterality: Bilateral;    History reviewed. No pertinent family history.  Social History:  reports that she has never smoked. She does not have any smokeless tobacco history on file. Her alcohol and drug histories are not on file.  Allergies:  Allergies  Allergen Reactions  . Nutritional Supplements   . Prednisone Other (See Comments)    Patient states that she can take this medication now  . Shrimp (Shellfish Allergy)     Medications: I have reviewed the patient's current medications.  Results for orders placed during the hospital encounter of 09/23/12 (from the past 48 hour(s))   CBC     Status: Abnormal   Collection Time    09/24/12 12:45 AM      Result Value Range   WBC 8.4  4.0 - 10.5 K/uL   RBC 3.59 (*) 3.87 - 5.11 MIL/uL   Hemoglobin 10.0 (*) 12.0 - 15.0 g/dL   HCT 11.9 (*) 14.7 - 82.9 %   MCV 82.7  78.0 - 100.0 fL   MCH 27.9  26.0 - 34.0 pg   MCHC 33.7  30.0 - 36.0 g/dL   RDW 56.2  13.0 - 86.5 %   Platelets 474 (*) 150 - 400 K/uL  BASIC METABOLIC PANEL     Status: Abnormal   Collection Time    09/24/12 12:45 AM      Result Value Range   Sodium 134 (*) 135 - 145 mEq/L   Potassium 3.8  3.5 - 5.1 mEq/L   Chloride 98  96 - 112 mEq/L   CO2 29  19 - 32 mEq/L   Glucose, Bld 226 (*) 70 - 99 mg/dL   BUN 7  6 - 23 mg/dL   Creatinine, Ser 7.84 (*) 0.50 - 1.10 mg/dL   Calcium 8.1 (*) 8.4 - 10.5 mg/dL   GFR calc non Af Amer >90  >90 mL/min   GFR calc Af Amer >90  >90 mL/min   Comment:  The eGFR has been calculated     using the CKD EPI equation.     This calculation has not been     validated in all clinical     situations.     eGFR's persistently     <90 mL/min signify     possible Chronic Kidney Disease.  PROTIME-INR     Status: None   Collection Time    09/24/12 12:45 AM      Result Value Range   Prothrombin Time 13.2  11.6 - 15.2 seconds   INR 1.01  0.00 - 1.49  HEMOGLOBIN A1C     Status: Abnormal   Collection Time    09/24/12 12:45 AM      Result Value Range   Hemoglobin A1C 9.8 (*) <5.7 %   Comment: (NOTE)                                                                               According to the ADA Clinical Practice Recommendations for 2011, when     HbA1c is used as a screening test:      >=6.5%   Diagnostic of Diabetes Mellitus               (if abnormal result is confirmed)     5.7-6.4%   Increased risk of developing Diabetes Mellitus     References:Diagnosis and Classification of Diabetes Mellitus,Diabetes     Care,2011,34(Suppl 1):S62-S69 and Standards of Medical Care in             Diabetes - 2011,Diabetes  Care,2011,34 (Suppl 1):S11-S61.   Mean Plasma Glucose 235 (*) <117 mg/dL  GLUCOSE, CAPILLARY     Status: Abnormal   Collection Time    09/24/12  7:41 AM      Result Value Range   Glucose-Capillary 178 (*) 70 - 99 mg/dL   Comment 1 Documented in Chart     Comment 2 Notify RN    GLUCOSE, CAPILLARY     Status: Abnormal   Collection Time    09/24/12  1:15 PM      Result Value Range   Glucose-Capillary 220 (*) 70 - 99 mg/dL   Comment 1 Documented in Chart     Comment 2 Notify RN      X-ray Chest Pa And Lateral   09/24/2012  *RADIOLOGY REPORT*  Clinical Data: Evaluate for pneumonia.  CHEST - 2 VIEW  Comparison: Chest x-ray 09/23/2012.  Findings: Again noted are several mass-like areas of airspace consolidation in the mid and lower lungs bilaterally which appear similar to the recent prior examination.  Small bilateral pleural effusions.  No evidence of pulmonary edema.  Heart size is within normal limits.  Upper mediastinal contours are unremarkable. Atherosclerosis in the thoracic aorta.  IMPRESSION: 1.  Multifocal mass-like areas of airspace consolidation in the mid- to-lower lungs bilaterally may reflect multilobar pneumonia, however, this may alternatively reflecting not infectious process such as cryptogenic organizing pneumonia (COP).  Clinical correlation is recommended. 2.  Atherosclerosis.   Original Report Authenticated By: Trudie Reed, M.D.     ROS:  No recent f/c/n/v/wt loss PE:  Blood pressure 125/75, pulse 83, temperature 98.4 F (36.9 C), temperature  source Oral, resp. rate 20, height 5\' 6"  (1.676 m), weight 95.255 kg (210 lb), SpO2 93.00%. wn wd woman in nad.  A and O x 4.  Mood and affect normal.  EOMI.  Respirations unlabored.  L knee with swelling and effusion.  Skin healthy and intact.  ROM limited due to pain.  5/5 strength in PF and DF of the ankle.  Sens to LT intact throughout L LE.  1+ dp and pt pulses.  No lymphadenopathy of the L LE.  CT scan:  I reviewed the  images from [place] and they show a displaced impacted fracture of the medial tibial plateau with some comminution.  Assessment/Plan: At this point the patient is admitted to the Hospitalist service with a tibial plateau fracture and pneumonia.  She should be NWB on the L LE.  She'll need operative treatment for this injury once she has stabilized from her pneumonia.  Dr. Thomasena Edis will see the pt tomorrow morning to further define the operative plan.   Chelsea Thornton 09/24/2012, 4:18 PM

## 2012-09-25 ENCOUNTER — Encounter (HOSPITAL_COMMUNITY): Payer: Self-pay | Admitting: Anesthesiology

## 2012-09-25 ENCOUNTER — Encounter (HOSPITAL_COMMUNITY)
Admission: AD | Disposition: A | Discharge status: Skilled Nursing Facility | Payer: Self-pay | Source: Other Acute Inpatient Hospital | Attending: Internal Medicine

## 2012-09-25 ENCOUNTER — Inpatient Hospital Stay (HOSPITAL_COMMUNITY): Payer: Commercial Managed Care - PPO

## 2012-09-25 ENCOUNTER — Inpatient Hospital Stay (HOSPITAL_COMMUNITY): Payer: Commercial Managed Care - PPO | Admitting: Anesthesiology

## 2012-09-25 DIAGNOSIS — E1310 Other specified diabetes mellitus with ketoacidosis without coma: Secondary | ICD-10-CM

## 2012-09-25 DIAGNOSIS — S82209A Unspecified fracture of shaft of unspecified tibia, initial encounter for closed fracture: Secondary | ICD-10-CM

## 2012-09-25 HISTORY — PX: ORIF TIBIA PLATEAU: SHX2132

## 2012-09-25 LAB — FERRITIN: Ferritin: 225 ng/mL (ref 10–291)

## 2012-09-25 LAB — CBC
HCT: 32.4 % — ABNORMAL LOW (ref 36.0–46.0)
Hemoglobin: 10.5 g/dL — ABNORMAL LOW (ref 12.0–15.0)
MCH: 27.3 pg (ref 26.0–34.0)
MCHC: 32.4 g/dL (ref 30.0–36.0)
RBC: 3.85 MIL/uL — ABNORMAL LOW (ref 3.87–5.11)

## 2012-09-25 LAB — GLUCOSE, CAPILLARY
Glucose-Capillary: 155 mg/dL — ABNORMAL HIGH (ref 70–99)
Glucose-Capillary: 156 mg/dL — ABNORMAL HIGH (ref 70–99)

## 2012-09-25 LAB — BASIC METABOLIC PANEL
BUN: 7 mg/dL (ref 6–23)
CO2: 29 mEq/L (ref 19–32)
GFR calc non Af Amer: >90 mL/min (ref >90)
Glucose, Bld: 156 mg/dL — ABNORMAL HIGH (ref 70–99)
Potassium: 3.9 mEq/L (ref 3.5–5.1)
Sodium: 135 mEq/L (ref 135–145)

## 2012-09-25 LAB — IRON AND TIBC
Iron: 36 ug/dL — ABNORMAL LOW (ref 42–135)
UIBC: 180 ug/dL (ref 125–400)

## 2012-09-25 LAB — FOLATE: Folate: 8.2 ng/mL

## 2012-09-25 LAB — SURGICAL PCR SCREEN: MRSA, PCR: NEGATIVE

## 2012-09-25 SURGERY — OPEN REDUCTION INTERNAL FIXATION (ORIF) TIBIAL PLATEAU
Anesthesia: General | Site: Leg Lower | Laterality: Left | Wound class: Clean

## 2012-09-25 MED ORDER — LACTATED RINGERS IV SOLN
INTRAVENOUS | Status: DC | PRN
  Administered 2012-09-25 (×2): via INTRAVENOUS

## 2012-09-25 MED ORDER — ENOXAPARIN SODIUM 30 MG/0.3ML ~~LOC~~ SOLN
30.0000 mg | Freq: Two times a day (BID) | SUBCUTANEOUS | Status: DC
  Administered 2012-09-26 – 2012-09-27 (×4): 30 mg via SUBCUTANEOUS
  Filled 2012-09-25 (×7): qty 0.3

## 2012-09-25 MED ORDER — POLYETHYLENE GLYCOL 3350 17 G PO PACK
17.0000 g | PACK | Freq: Two times a day (BID) | ORAL | Status: DC
  Administered 2012-09-25 – 2012-09-27 (×4): 17 g via ORAL

## 2012-09-25 MED ORDER — ROCURONIUM BROMIDE 100 MG/10ML IV SOLN
INTRAVENOUS | Status: DC | PRN
  Administered 2012-09-25: 10 mg via INTRAVENOUS
  Administered 2012-09-25: 30 mg via INTRAVENOUS
  Administered 2012-09-25: 10 mg via INTRAVENOUS

## 2012-09-25 MED ORDER — LIDOCAINE HCL (CARDIAC) 20 MG/ML IV SOLN
INTRAVENOUS | Status: DC | PRN
  Administered 2012-09-25: 100 mg via INTRAVENOUS

## 2012-09-25 MED ORDER — HYDRALAZINE HCL 20 MG/ML IJ SOLN: 10.0000 mg | Freq: Four times a day (QID) | INTRAMUSCULAR | Status: DC | PRN

## 2012-09-25 MED ORDER — ACETAMINOPHEN 10 MG/ML IV SOLN
INTRAVENOUS | Status: DC | PRN
  Administered 2012-09-25: 1000 mg via INTRAVENOUS

## 2012-09-25 MED ORDER — CEFAZOLIN SODIUM-DEXTROSE 2-3 GM-% IV SOLR
INTRAVENOUS | Status: DC | PRN
  Administered 2012-09-25: 2 g via INTRAVENOUS

## 2012-09-25 MED ORDER — LACTATED RINGERS IV SOLN: INTRAVENOUS | Status: DC

## 2012-09-25 MED ORDER — GLYCOPYRROLATE 0.2 MG/ML IJ SOLN
INTRAMUSCULAR | Status: DC | PRN
  Administered 2012-09-25: 0.6 mg via INTRAVENOUS

## 2012-09-25 MED ORDER — ONDANSETRON HCL 4 MG/2ML IJ SOLN
INTRAMUSCULAR | Status: DC | PRN
  Administered 2012-09-25: 4 mg via INTRAVENOUS

## 2012-09-25 MED ORDER — HYDROMORPHONE HCL PF 1 MG/ML IJ SOLN: 0.2500 mg | INTRAMUSCULAR | Status: DC | PRN

## 2012-09-25 MED ORDER — SENNOSIDES-DOCUSATE SODIUM 8.6-50 MG PO TABS
1.0000 | ORAL_TABLET | Freq: Every day | ORAL | Status: DC
  Administered 2012-09-25 – 2012-09-26 (×2): 1 via ORAL
  Filled 2012-09-25 (×5): qty 1

## 2012-09-25 MED ORDER — PROPOFOL 10 MG/ML IV BOLUS
INTRAVENOUS | Status: DC | PRN
  Administered 2012-09-25: 170 mg via INTRAVENOUS

## 2012-09-25 MED ORDER — HYDROMORPHONE HCL PF 1 MG/ML IJ SOLN
INTRAMUSCULAR | Status: DC | PRN
  Administered 2012-09-25 (×2): 1 mg via INTRAVENOUS

## 2012-09-25 MED ORDER — LABETALOL HCL 5 MG/ML IV SOLN
5.0000 mg | INTRAVENOUS | Status: DC | PRN
  Administered 2012-09-25: 5 mg via INTRAVENOUS

## 2012-09-25 MED ORDER — NEOSTIGMINE METHYLSULFATE 1 MG/ML IJ SOLN
INTRAMUSCULAR | Status: DC | PRN
  Administered 2012-09-25: 5 mg via INTRAVENOUS

## 2012-09-25 MED ORDER — SUCCINYLCHOLINE CHLORIDE 20 MG/ML IJ SOLN
INTRAMUSCULAR | Status: DC | PRN
  Administered 2012-09-25: 100 mg via INTRAVENOUS

## 2012-09-25 MED ORDER — FENTANYL CITRATE 0.05 MG/ML IJ SOLN
INTRAMUSCULAR | Status: DC | PRN
  Administered 2012-09-25 (×5): 50 ug via INTRAVENOUS

## 2012-09-25 MED ORDER — MIDAZOLAM HCL 5 MG/5ML IJ SOLN
INTRAMUSCULAR | Status: DC | PRN
  Administered 2012-09-25: 2 mg via INTRAVENOUS

## 2012-09-25 MED ORDER — SODIUM CHLORIDE 0.9 % IV SOLN
250.0000 mL | INTRAVENOUS | Status: DC | PRN
  Administered 2012-09-25: 1000 mL via INTRAVENOUS

## 2012-09-25 MED ORDER — 0.9 % SODIUM CHLORIDE (POUR BTL) OPTIME
TOPICAL | Status: DC | PRN
  Administered 2012-09-25: 1000 mL

## 2012-09-25 SURGICAL SUPPLY — 84 items
BAG SPEC THK2 15X12 ZIP CLS (MISCELLANEOUS) ×1
BAG ZIPLOCK 12X15 (MISCELLANEOUS) ×2 IMPLANT
BANDAGE ELASTIC 6 VELCRO ST LF (GAUZE/BANDAGES/DRESSINGS) ×1 IMPLANT
BANDAGE GAUZE ELAST BULKY 4 IN (GAUZE/BANDAGES/DRESSINGS) ×1 IMPLANT
BIT DRILL 100X2.5XANTM LCK (BIT) IMPLANT
BIT DRILL CAL (BIT) IMPLANT
BIT DRILL SLEEVE MEASURING (BIT) IMPLANT
BIT DRL 100X2.5XANTM LCK (BIT) ×1
BNDG COHESIVE 4X5 TAN STRL (GAUZE/BANDAGES/DRESSINGS) ×2 IMPLANT
BNDG COHESIVE 6X5 TAN STRL LF (GAUZE/BANDAGES/DRESSINGS) ×2 IMPLANT
CEMENT CAL PHOSPHATE 5CC (Cement) ×1 IMPLANT
CHLORAPREP W/TINT 26ML (MISCELLANEOUS) ×2 IMPLANT
CLOTH BEACON ORANGE TIMEOUT ST (SAFETY) ×2 IMPLANT
CLSR STERI-STRIP ANTIMIC 1/2X4 (GAUZE/BANDAGES/DRESSINGS) ×1 IMPLANT
CUFF TOURN SGL QUICK 34 (TOURNIQUET CUFF) ×2
CUFF TRNQT CYL 34X4X40X1 (TOURNIQUET CUFF) ×1 IMPLANT
DRAPE C-ARM 42X72 X-RAY (DRAPES) ×2 IMPLANT
DRAPE INCISE 23X17 IOBAN STRL (DRAPES) ×1
DRAPE INCISE 23X17 STRL (DRAPES) IMPLANT
DRAPE INCISE IOBAN 23X17 STRL (DRAPES) ×1 IMPLANT
DRAPE INCISE IOBAN 66X45 STRL (DRAPES) ×1 IMPLANT
DRAPE LG THREE QUARTER DISP (DRAPES) ×2 IMPLANT
DRAPE U-SHAPE 47X51 STRL (DRAPES) ×2 IMPLANT
DRILL BIT 2.5MM (BIT) ×2
DRILL BIT CAL (BIT) ×2
DRILL SLEEVE MEASURING (BIT) ×2
DRSG EMULSION OIL 3X16 NADH (GAUZE/BANDAGES/DRESSINGS) ×2 IMPLANT
DURAPREP 26ML APPLICATOR (WOUND CARE) ×1 IMPLANT
ELECT REM PT RETURN 9FT ADLT (ELECTROSURGICAL) ×2
ELECTRODE REM PT RTRN 9FT ADLT (ELECTROSURGICAL) ×1 IMPLANT
GLOVE ECLIPSE 8.0 STRL XLNG CF (GLOVE) ×2 IMPLANT
GLOVE ORTHO TXT STRL SZ7.5 (GLOVE) ×2 IMPLANT
GOWN STRL REIN XL XLG (GOWN DISPOSABLE) ×2 IMPLANT
IMMOBILIZER KNEE 20 (SOFTGOODS)
IMMOBILIZER KNEE 20 THIGH 36 (SOFTGOODS) ×1 IMPLANT
IMMOBILIZER KNEE 22 UNIV (SOFTGOODS) ×1 IMPLANT
K-WIRE ACE 1.6X6 (WIRE) ×8
KIT BASIN OR (CUSTOM PROCEDURE TRAY) ×2 IMPLANT
KWIRE ACE 1.6X6 (WIRE) IMPLANT
NDL SPNL 18GX3.5 QUINCKE PK (NEEDLE) IMPLANT
NEEDLE SPNL 18GX3.5 QUINCKE PK (NEEDLE) ×2 IMPLANT
NS IRRIG 1000ML POUR BTL (IV SOLUTION) ×2 IMPLANT
PACK LOWER EXTREMITY WL (CUSTOM PROCEDURE TRAY) ×1 IMPLANT
PACK TOTAL JOINT (CUSTOM PROCEDURE TRAY) ×1 IMPLANT
PAD CAST 4YDX4 CTTN HI CHSV (CAST SUPPLIES) ×2 IMPLANT
PADDING CAST COTTON 4X4 STRL (CAST SUPPLIES) ×4
PLATE LOCK 5H STD LT PROX TIB (Plate) ×1 IMPLANT
PLATE LOCK COMP 5H FOOT (Plate) ×1 IMPLANT
POSITIONER SURGICAL ARM (MISCELLANEOUS) ×3 IMPLANT
PUTTY GAMMA BSM 5CC (Putty) ×1 IMPLANT
SCREW CORT FT 32X3.5XNONLOCK (Screw) IMPLANT
SCREW CORTICAL 3.5MM  32MM (Screw) ×1 IMPLANT
SCREW CORTICAL 3.5MM  55MM (Screw) ×1 IMPLANT
SCREW CORTICAL 3.5MM 32MM (Screw) ×1 IMPLANT
SCREW CORTICAL 3.5MM 40MM (Screw) ×1 IMPLANT
SCREW CORTICAL 3.5MM 50MM (Screw) ×1 IMPLANT
SCREW CORTICAL 3.5MM 55MM (Screw) IMPLANT
SCREW LOCK CORT STAR 3.5X30 (Screw) ×2 IMPLANT
SCREW LOCK CORT STAR 3.5X32 (Screw) ×1 IMPLANT
SCREW LOCK CORT STAR 3.5X65 (Screw) ×3 IMPLANT
SCREW LOCK CORT STAR 3.5X70 (Screw) ×1 IMPLANT
SCREW LOCK CORT STAR 3.5X75 (Screw) ×2 IMPLANT
SCREW LOCK CORT STAR 3.5X80 (Screw) ×1 IMPLANT
SCREW LP 3.5X85MM (Screw) ×1 IMPLANT
SET PAD KNEE POSITIONER (MISCELLANEOUS) ×1 IMPLANT
SPONGE GAUZE 4X4 12PLY (GAUZE/BANDAGES/DRESSINGS) ×3 IMPLANT
SPONGE LAP 18X18 X RAY DECT (DISPOSABLE) ×4 IMPLANT
STOCKINETTE 8 INCH (MISCELLANEOUS) ×2 IMPLANT
STRIP CLOSURE SKIN 1/2X4 (GAUZE/BANDAGES/DRESSINGS) ×2 IMPLANT
SUCTION FRAZIER TIP 10 FR DISP (SUCTIONS) ×2 IMPLANT
SUT ETHILON 4 0 PS 2 18 (SUTURE) ×2 IMPLANT
SUT FIBERWIRE #2 38 T-5 BLUE (SUTURE) ×4
SUT MNCRL AB 3-0 PS2 18 (SUTURE) ×1 IMPLANT
SUT MNCRL AB 4-0 PS2 18 (SUTURE) ×2 IMPLANT
SUT VIC AB 0 CT1 36 (SUTURE) ×2 IMPLANT
SUT VIC AB 1 CT1 27 (SUTURE) ×10
SUT VIC AB 1 CT1 27XBRD ANTBC (SUTURE) ×5 IMPLANT
SUT VIC AB 1 CT1 36 (SUTURE) ×2 IMPLANT
SUT VIC AB 2-0 CT1 27 (SUTURE) ×4
SUT VIC AB 2-0 CT1 TAPERPNT 27 (SUTURE) IMPLANT
SUT VIC AB 2-0 CT2 27 (SUTURE) ×2 IMPLANT
SUTURE FIBERWR #2 38 T-5 BLUE (SUTURE) IMPLANT
TOWEL OR 17X26 10 PK STRL BLUE (TOWEL DISPOSABLE) ×4 IMPLANT
WATER STERILE IRR 1500ML POUR (IV SOLUTION) ×2 IMPLANT

## 2012-09-25 NOTE — Progress Notes (Signed)
[facility] PROGRESS NOTE  Ia Leeb WGN:[id number] DOB: Apr 09, 1951 DOA: 09/23/2012 PCP: [name], MD  Assessment/Plan:  #1 left tibial plateau fracture Per CT scan of 09/16/2012 patient with a fracture extending from the lateral tibial eminence through the medial tibial metaphysis with minimal impaction along the medial tibial metaphysis. Associated joint effusion noted. Patient was transferred from [facility] for repair of fracture per her orthopod Dr. Thomasena Edis. Patient with swelling in the left knee. Patient has been seen by orthopedics and patient scheduled for ORIF and probable allograft today per Dr. Thomasena Edis. Per orthopedics.  #2 abnormal chest x-ray Patient was being treated around the hospital for community acquired pneumonia secondary to abnormal chest x-ray. Patient denies any respiratory symptoms. Patient denies any fever, no chills, no cough, no congestion. Patient is being followed by Dr. Shelle Iron of [place] pulmonary secondary to abnormal chest x-ray findings which have been chronic. Patient was treated with a short course of prednisone with symptomatic improvement and partial clearing per CT per Dr. Teddy Spike note of 04/20/2012. Was recommended that patient be monitored off steroids with close followup with Dr. Shelle Iron as outpatient. Patient states she's been asymptomatic and is to see Dr. Shelle Iron in March for further evaluation. Patient has been treated with 5 days of antibiotics at [facility]. Antibiotics were discontinued yesterday. Patient will need to followup as outpatient with Dr. Shelle Iron.  #3 diabetes mellitus Hemoglobin A1c is 9.8. CBGs 155-210. Hold oral hypoglycemic agents. Ccontinue sliding scale insulin. Follow.  #4 urinary retention Continue Foley catheter. Follow.  #5 anemia No overt GI bleed. Anemia panel pending. Follow H&H.  #6 prophylaxis Lovenox for DVT prophylaxis.  Code Status: Full Family Communication: Updated patient at bedside.  No family present. Disposition Plan: Home vs SNF when medically stable   Consultants:  Orthopedic Dr. Victorino Dike 09/24/2012  Procedures:  Chest x-ray 09/24/2012  Antibiotics:  Levaquin 09/23/2012 to 09/24/2012  HPI/Subjective: Patient denies any chest pain. Patient denies any shortness of breath. Patient denies any cough, no fever, no chills, no other associated symptoms. Patient complaining of left knee pain and inability to bear weight.  Objective: Filed Vitals:   09/23/12 2108 09/24/12 0626 09/24/12 1403 09/24/12 2236  BP: 152/76 144/60 125/75 154/60  Pulse: 85 79 83 79  Temp: 99 F (37.2 C) 98.5 F (36.9 C) 98.4 F (36.9 C) 98.8 F (37.1 C)  TempSrc: Oral Oral Oral Oral  Resp: 20 18 20 20   Height: 5\' 6"  (1.676 m)     Weight: 95.255 kg (210 lb)     SpO2: 92% 95% 93% 93%    Intake/Output Summary (Last 24 hours) at 09/25/12 0835 Last data filed at 09/25/12 0107  Gross per 24 hour  Intake   1083 ml  Output   2750 ml  Net  -1667 ml   Filed Weights   09/23/12 2108  Weight: 95.255 kg (210 lb)    Exam:   General:  NAD  Cardiovascular: RRR, no m/r/g  Respiratory: Clear to auscultation bilaterally. No wheezes, no crackles, no rhonchi.  Abdomen: Soft, nontender, nondistended, positive bowel sounds.  Extremities: No clubbing cyanosis or edema. Left knee swelling. Left lateral knee tender to palpation. No warmth.  Data Reviewed: Basic Metabolic Panel:  Recent Labs Lab 09/24/12 0045 09/25/12 0428  NA 134* 135  K 3.8 3.9  CL 98 98  CO2 29 29  GLUCOSE 226* 156*  BUN 7 7  CREATININE 0.37* 0.35*  CALCIUM 8.1* 8.6   Liver Function Tests: No results found for this  basename: AST, ALT, ALKPHOS, BILITOT, PROT, ALBUMIN,  in the last 168 hours No results found for this basename: LIPASE, AMYLASE,  in the last 168 hours No results found for this basename: AMMONIA,  in the last 168 hours CBC:  Recent Labs Lab 09/24/12 0045 09/25/12 0428  WBC 8.4 8.8  HGB  10.0* 10.5*  HCT 29.7* 32.4*  MCV 82.7 84.2  PLT 474* 528*   Cardiac Enzymes: No results found for this basename: CKTOTAL, CKMB, CKMBINDEX, TROPONINI,  in the last 168 hours BNP (last 3 results) No results found for this basename: PROBNP,  in the last 8760 hours CBG:  Recent Labs Lab 09/24/12 0741 09/24/12 1315 09/24/12 1647 09/24/12 2239 09/25/12 0730  GLUCAP 178* 220* 210* 188* 155*    No results found for this or any previous visit (from the past 240 hour(s)).   Studies: X-ray Chest Pa And Lateral   09/24/2012  *RADIOLOGY REPORT*  Clinical Data: Evaluate for pneumonia.  CHEST - 2 VIEW  Comparison: Chest x-ray 09/23/2012.  Findings: Again noted are several mass-like areas of airspace consolidation in the mid and lower lungs bilaterally which appear similar to the recent prior examination.  Small bilateral pleural effusions.  No evidence of pulmonary edema.  Heart size is within normal limits.  Upper mediastinal contours are unremarkable. Atherosclerosis in the thoracic aorta.  IMPRESSION: 1.  Multifocal mass-like areas of airspace consolidation in the mid- to-lower lungs bilaterally may reflect multilobar pneumonia, however, this may alternatively reflecting not infectious process such as cryptogenic organizing pneumonia (COP).  Clinical correlation is recommended. 2.  Atherosclerosis.   Original Report Authenticated By: Trudie Reed, M.D.     Scheduled Meds: . docusate sodium  100 mg Oral BID  . insulin aspart  0-15 Units Subcutaneous TID WC  . insulin glargine  5 Units Subcutaneous QHS  . polyethylene glycol  17 g Oral BID  . senna-docusate  1 tablet Oral QHS  . sodium chloride  3 mL Intravenous Q12H   Continuous Infusions:   Principal Problem:   Knee injury Active Problems:   Alveolar pneumopathy   Acute respiratory failure with hypoxia   DM (diabetes mellitus)   Urinary retention   Anemia   Abnormal CXR   Left tibial fracture    Time spent: > 35  mins    [facility]  [facility] Pager [phone]. If 8PM-8AM, please contact night-coverage at [email or web address], password [facility] 09/25/2012, 8:35 AM  LOS: 2 days

## 2012-09-25 NOTE — Progress Notes (Signed)
LEFT KNEE HIGH AND LEFT PAS APPLIED AS ORDERED.

## 2012-09-25 NOTE — Transfer of Care (Signed)
Immediate Anesthesia Transfer of Care Note  Patient: Chelsea Thornton  Procedure(s) Performed: Procedure(s): OPEN REDUCTION INTERNAL FIXATION (ORIF) TIBIAL PLATEAU (Left)  Patient Location: PACU  Anesthesia Type:General  Level of Consciousness: sedated  Airway & Oxygen Therapy: Patient Spontanous Breathing and Patient connected to face mask oxygen  Post-op Assessment: Report given to PACU RN and Post -op Vital signs reviewed and stable  Post vital signs: Reviewed and stable  Complications: No apparent anesthesia complications

## 2012-09-25 NOTE — Progress Notes (Signed)
Dr. Leta Jungling made aware of patient's CBG  Results in PACU- 183- no Insulin coverage at this time in PACU; ALSO MADE AWARE OF PATIENT'S BLOOD PRESSURES AND HEART RATES IN PACU- ORDERS GIVEN.

## 2012-09-25 NOTE — Progress Notes (Signed)
Blood pressure now 153/ 57

## 2012-09-25 NOTE — Anesthesia Postprocedure Evaluation (Signed)
  Anesthesia Post-op Note  Patient: Chelsea Thornton  Procedure(s) Performed: Procedure(s) (LRB): OPEN REDUCTION INTERNAL FIXATION (ORIF) TIBIAL PLATEAU (Left)  Patient Location: PACU  Anesthesia Type: General  Level of Consciousness: awake and alert   Airway and Oxygen Therapy: Patient Spontanous Breathing  Post-op Pain: mild  Post-op Assessment: Post-op Vital signs reviewed, Patient's Cardiovascular Status Stable, Respiratory Function Stable, Patent Airway and No signs of Nausea or vomiting  Last Vitals:  Filed Vitals:   09/25/12 1500  BP: 180/68  Pulse: 87  Temp:   Resp: 16    Post-op Vital Signs: stable   Complications: No apparent anesthesia complications

## 2012-09-25 NOTE — Progress Notes (Signed)
Labetalol 5 mg IVP given as orderd for elevated blood pressure.

## 2012-09-25 NOTE — Op Note (Signed)
Dictated [phone]

## 2012-09-25 NOTE — Progress Notes (Signed)
Subjective: Comfortable ready for surgery    Objective: Vital signs in last 24 hours: Temp:  [98.4 F (36.9 C)-98.8 F (37.1 C)] 98.8 F (37.1 C) (02/15 2236) Pulse Rate:  [79-83] 79 (02/15 2236) Resp:  [20] 20 (02/15 2236) BP: (125-154)/(60-75) 154/60 mmHg (02/15 2236) SpO2:  [93 %] 93 % (02/15 2236)  Intake/Output from previous day: 02/15 0701 - 02/16 0700 In: 1083 [P.O.:1080; I.V.:3] Out: 2750 [Urine:2750] Intake/Output this shift:     Recent Labs  09/24/12 0045 09/25/12 0428  HGB 10.0* 10.5*    Recent Labs  09/24/12 0045 09/25/12 0428  WBC 8.4 8.8  RBC 3.59* 3.85*  HCT 29.7* 32.4*  PLT 474* 528*    Recent Labs  09/24/12 0045 09/25/12 0428  NA 134* 135  K 3.8 3.9  CL 98 98  CO2 29 29  BUN 7 7  CREATININE 0.37* 0.35*  GLUCOSE 226* 156*  CALCIUM 8.1* 8.6    Recent Labs  09/24/12 0045  INR 1.01    Sensation intact distally mild swelling compt soft vasc intact  Assessment/Plan: Plan ORIF and probable allograft today. She understyands all the risk and benefits and wishes to proceed .  Also discussed need for SNF after and they all agree. Discussed with Dr Janee Morn and cleared for surgery. Pre op anemia may need trans after. Also Ca 1500 mg and Vit D 2,000 iu daily after . And may need bisphofonate down the road.   Chelsea Thornton ANDREW 09/25/2012, 9:03 AM

## 2012-09-26 DIAGNOSIS — K5900 Constipation, unspecified: Secondary | ICD-10-CM | POA: Diagnosis present

## 2012-09-26 LAB — CBC
Hemoglobin: 10.8 g/dL — ABNORMAL LOW (ref 12.0–15.0)
MCHC: 33.4 g/dL (ref 30.0–36.0)
RDW: 12.6 % (ref 11.5–15.5)
WBC: 11.4 10*3/uL — ABNORMAL HIGH (ref 4.0–10.5)

## 2012-09-26 LAB — BASIC METABOLIC PANEL
Chloride: 95 mEq/L — ABNORMAL LOW (ref 96–112)
GFR calc Af Amer: >90 mL/min (ref >90)
GFR calc non Af Amer: >90 mL/min (ref >90)
Glucose, Bld: 166 mg/dL — ABNORMAL HIGH (ref 70–99)
Potassium: 4.2 mEq/L (ref 3.5–5.1)
Sodium: 132 mEq/L — ABNORMAL LOW (ref 135–145)

## 2012-09-26 MED ORDER — OXYCODONE HCL 5 MG PO TABS
5.0000 mg | ORAL_TABLET | ORAL | Status: DC | PRN
  Administered 2012-09-26 (×2): 10 mg via ORAL
  Filled 2012-09-26 (×2): qty 2

## 2012-09-26 MED ORDER — MORPHINE SULFATE 2 MG/ML IJ SOLN: 1.0000 mg | INTRAMUSCULAR | Status: DC | PRN

## 2012-09-26 MED ORDER — ACETAMINOPHEN 500 MG PO TABS
500.0000 mg | ORAL_TABLET | Freq: Three times a day (TID) | ORAL | Status: DC
  Administered 2012-09-26 – 2012-09-29 (×8): 500 mg via ORAL
  Filled 2012-09-26 (×16): qty 1

## 2012-09-26 MED ORDER — SORBITOL 70 % SOLN
30.0000 mL | Freq: Every day | Status: DC | PRN
  Administered 2012-09-26 (×2): 30 mL via ORAL
  Filled 2012-09-26 (×2): qty 30

## 2012-09-26 MED ORDER — FLEET ENEMA 7-19 GM/118ML RE ENEM
1.0000 | ENEMA | Freq: Every day | RECTAL | Status: DC | PRN
  Administered 2012-09-27: 1 via RECTAL
  Filled 2012-09-26: qty 1

## 2012-09-26 MED ORDER — TAMSULOSIN HCL 0.4 MG PO CAPS
0.4000 mg | ORAL_CAPSULE | Freq: Every day | ORAL | Status: DC
  Administered 2012-09-26 – 2012-09-28 (×3): 0.4 mg via ORAL
  Filled 2012-09-26 (×4): qty 1

## 2012-09-26 NOTE — Progress Notes (Signed)
[facility] PROGRESS NOTE  Chelsea Thornton ZOX:[id number] DOB: 1950-10-03 DOA: 09/23/2012 PCP: [name], MD  Assessment/Plan:  #1 left tibial plateau fracture Per CT scan of 09/16/2012 patient with a fracture extending from the lateral tibial eminence through the medial tibial metaphysis with minimal impaction along the medial tibial metaphysis. Associated joint effusion noted. Patient was transferred from [facility] for repair of fracture per her orthopod Dr. Thomasena Edis. Patient with swelling in the left knee. Patient has been seen by orthopedics and patient s/p  ORIF yesterday. Patient will likely need nonweightbearing for 4-6 weeks per orthopedics. Per orthopedics.  #2 abnormal chest x-ray Patient was being treated around the hospital for community acquired pneumonia secondary to abnormal chest x-ray. Patient denies any respiratory symptoms. Patient denies any fever, no chills, no cough, no congestion. Patient is being followed by Dr. Shelle Iron of [place] [facility] secondary to abnormal chest x-ray findings which have been chronic. Patient was treated with a short course of prednisone with symptomatic improvement and partial clearing per CT per Dr. Teddy Spike note of 04/20/2012. Was recommended that patient be monitored off steroids with close followup with Dr. Shelle Iron as outpatient. Patient states she's been asymptomatic and is to see Dr. Shelle Iron in March for further evaluation. Patient has been treated with 5 days of antibiotics at [facility]. Antibiotics were discontinued. Patient will need to followup as outpatient with Dr. Shelle Iron.  #3 diabetes mellitus Hemoglobin A1c is 9.8. CBGs 156-204. Hold oral hypoglycemic agents.Lantus 5 units daily. Continue sliding scale insulin. Follow.  #4 urinary retention Continue Foley catheter. Will place on Flomax. Will also treat constipation. Will likely need outpatient followup with urology for voiding trial.Follow.  #5 anemia of chronic  disease No overt GI bleed. Anemia panel consistent with anemia of chronic disease.  Follow H&H.  #6 constipation Continue MiraLAX twice a day and Senokot. Enema when necessary. Will give a dose of sorbitol.  #7 prophylaxis Lovenox for DVT prophylaxis.  Code Status: Full Family Communication: Updated patient at bedside. No family present. Disposition Plan: SNF when medically stable   Consultants:  Orthopedic Dr. Victorino Dike 09/24/2012  Procedures:  Chest x-ray 09/24/2012  ORIF left tibial plateau 09/25/2012 per Dr. Thomasena Edis  Antibiotics:  Levaquin 09/23/2012 to 09/24/2012  HPI/Subjective: Patient denies any chest pain. Patient denies any shortness of breath. Patient denies any cough, no fever, no chills, no other associated symptoms. Patient complaining of left knee pain.  Objective: Filed Vitals:   09/25/12 1903 09/25/12 2140 09/26/12 0210 09/26/12 0456  BP: 152/63 156/64 144/78 148/74  Pulse: 80 86 83 101  Temp: 97.4 F (36.3 C) 97.9 F (36.6 C) 99.6 F (37.6 C) 98.1 F (36.7 C)  TempSrc: Oral Oral Oral Oral  Resp: 16 20 20 18   Height:      Weight:      SpO2: 100% 98% 83% 94%    Intake/Output Summary (Last 24 hours) at 09/26/12 1039 Last data filed at 09/26/12 0814  Gross per 24 hour  Intake   1970 ml  Output   3413 ml  Net  -1443 ml   Filed Weights   09/23/12 2108  Weight: 95.255 kg (210 lb)    Exam:   General:  NAD  Cardiovascular: RRR, no m/r/g  Respiratory: Clear to auscultation bilaterally. No wheezes, no crackles, no rhonchi.  Abdomen: Soft, nontender, nondistended, positive bowel sounds.  Extremities: No clubbing cyanosis or edema. Left lower extremity in knee immobilizer.   Data Reviewed: Basic Metabolic Panel:  Recent Labs Lab 09/24/12 0045 09/25/12  8119 09/26/12 0425  NA 134* 135 132*  K 3.8 3.9 4.2  CL 98 98 95*  CO2 29 29 28   GLUCOSE 226* 156* 166*  BUN 7 7 7   CREATININE 0.37* 0.35* 0.34*  CALCIUM 8.1* 8.6 8.7   Liver  Function Tests: No results found for this basename: AST, ALT, ALKPHOS, BILITOT, PROT, ALBUMIN,  in the last 168 hours No results found for this basename: LIPASE, AMYLASE,  in the last 168 hours No results found for this basename: AMMONIA,  in the last 168 hours CBC:  Recent Labs Lab 09/24/12 0045 09/25/12 0428 09/26/12 0425  WBC 8.4 8.8 11.4*  HGB 10.0* 10.5* 10.8*  HCT 29.7* 32.4* 32.3*  MCV 82.7 84.2 83.0  PLT 474* 528* 584*   Cardiac Enzymes: No results found for this basename: CKTOTAL, CKMB, CKMBINDEX, TROPONINI,  in the last 168 hours BNP (last 3 results) No results found for this basename: PROBNP,  in the last 8760 hours CBG:  Recent Labs Lab 09/25/12 0730 09/25/12 1456 09/25/12 1709 09/25/12 2227 09/26/12 0727  GLUCAP 155* 183* 204* 156* 187*    Recent Results (from the past 240 hour(s))  SURGICAL PCR SCREEN     Status: None   Collection Time    09/25/12  9:59 AM      Result Value Range Status   MRSA, PCR NEGATIVE  NEGATIVE Final   Staphylococcus aureus NEGATIVE  NEGATIVE Final   Comment:            The Xpert SA Assay (FDA     approved for NASAL specimens     in patients over 62 years of age),     is one component of     a comprehensive surveillance     program.  Test performance has     been validated by [employer] for patients greater     than or equal to 62 year old.     It is not intended     to diagnose infection nor to     guide or monitor treatment.     Studies: Dg C-arm 61-120 Min-no Report  09/25/2012  CLINICAL DATA: left tibial plateau fracture   C-ARM 61-120 MINUTES  Fluoroscopy was utilized by the requesting physician.  No radiographic  interpretation.     Dg Knee 2 Views Left  09/25/2012  *RADIOLOGY REPORT*  Clinical Data: Left tibial plateau fracture, ORIF.  LEFT KNEE - 3 VIEW  Comparison: 09/19/2012  Findings: Two intraoperative spot images demonstrate plate and screw fixation of the proximal tibial fracture.  Near anatomic  alignment.  No hardware or bony complicating feature.  IMPRESSION: Internal fixation of the proximal tibial fracture.   Original Report Authenticated By: Charlett Nose, M.D.     Scheduled Meds: . acetaminophen  500 mg Oral Q8H  . docusate sodium  100 mg Oral BID  . enoxaparin (LOVENOX) injection  30 mg Subcutaneous Q12H  . insulin aspart  0-15 Units Subcutaneous TID WC  . insulin glargine  5 Units Subcutaneous QHS  . polyethylene glycol  17 g Oral BID  . senna-docusate  1 tablet Oral QHS  . sodium chloride  3 mL Intravenous Q12H   Continuous Infusions:   Principal Problem:   Knee injury Active Problems:   Alveolar pneumopathy   Acute respiratory failure with hypoxia   DM (diabetes mellitus)   Urinary retention   Anemia   Abnormal CXR   Left tibial fracture   Unspecified constipation  Time spent: > 35 mins    [facility]  [facility] Pager [phone]. If 8PM-8AM, please contact night-coverage at [email or web address], password [facility] 09/26/2012, 10:39 AM  LOS: 3 days

## 2012-09-26 NOTE — Op Note (Signed)
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  PROCEDURE:  Left tibial plateau open reduction, internal fixation, opening by calcium phosphate cement.  C-arm radiography.  SURGEON::  Erasmo Leventhal, MD.  ASSISTANT:  Jamelle Rushing, PA-C  ANESTHESIA:  General.  ESTIMATED BLOOD LOSS:  Less than 100 mL.  DRAINS:  One Hemovac.  COMPLICATIONS:  None.  TOURNIQUET TIME:  2 hours and 5 minutes at 300 mL.  DISPOSITION:  To PACU, stable.  OPERATIVE DETAILS:  The patient's family was counseled preoperatively. They discussed and they understood all the risks and benefits, wished to proceed.  The patient's correct side was identified in the holding area. The patient was taken to the operating room.  On the way to the operating room, IV Ancef was given.  In the OR, the patient was placed in supine position, general anesthesia.  Foley catheter placed on sterile technique by the OR circulating nurse.  The left foot was elevated.  She was then prepped with ChloraPrep in a sterile fashion due to iodine allergy.  __________ Esmarch tourniquet inflated to 300 mmHg __________.  At this point in time, it is opted to keep the incisions in midline as __________ somewhat laterally in order to get a lateral plate due to severe comminution that she had marked  osteopenia.  Subcutaneous skin and subcutaneous tissue.  At this time, the medial soft tissue envelope was open at subperiosteal fashion, lifted up gently __________ insertion and unfortunately the tibial collateral ligament insertion site at superficial were left in the majority intact and soft tissue envelope.  She was found to have severe comminution medially, severe osteopenia medially, and the entire medial wall was fragmented and actually developed in the periosteal envelope.  In the valgus stress of the knee and the Cobb elevator, the major flap was then elevated as well as possible.  C-arm confirmed that this elevated joint line to the anatomic position and looked satisfactory as far as the medial displacement and depression.  On the lateral side, the fascia was then opened __________ lifted up. Subperiosteal sites were undertaken.  We chose a Biomet titanium lateral tibial plateau locking plate applied in correct position.  K-wires were then placed from lateral to medial.  C-arm spots were utilized throughout the entire case.  AP and RP with adequate lateral planes confirmed reduction and placed with implants and guidewires.  We apparently had an open reduction.  At this point in time, a nonlocking screw was placed from lateral to medial to the medial extent and then this plate was then filled with a total of 6 screws from  lateral to medial supporting the medial fragment and the subchondral bone confirming it was in the subchondral bone, not in the joint on AP and lateral planes.  Two kickstand screws were in place from lateral to proximal medial to reinforce this area.  Then, the plate was also held distally with 3.5 screws.  At this point in time, I still felt concerned about the medial side collapsing a titanium composite plate was then applied in the correct location.  They firmly placed with 3 screws distally and no screws proximally.  We used the proximal portion of the  plate as a buttress __________ hopefully lateral distortion and displacement of the proximal and the medial fragments.  C-arm then confirmed AP and lateral planes with excellent reduction of fractures and placed with implants.  At this time, we used a Biomet calcium phosphate cement to different types liquid and paste, and then placed this into the fracture site after copiously irrigating the area.  We confirmed that there was no extrusion of the cement on any plane anterior and a posterior.  The soft tissue envelope was then meticulously closed on the medial side over the medial fragments and also re-secured in the pes tendon insertions making sure the tibial collateral ligament insertion site was secured.  Laterally, the IP band which had been split was closed with Vicryl suture.  The anterior compartment fasciotomy with scissors and then with digital palpation distally protecting the neurovascular structures.  The anterior compartment was left open.  The subcutaneous tissue was then closed in 2 layers with inverted Vicryl sutures.  The drain was placed, brought out through a separate stab wound proximally and left over the fracture site and anterolaterally.  Skin was closed with __________ Monocryl suture.  Steri-Strips applied.  Sterile dressing was applied.  Tourniquet was deflated.  Normal circulation at foot and ankle in the case.  She was placed into a knee immobilizer. She was awake and she was taken to operative PACU in stable condition. Sponge and needle count correct.  No complications or problems.  She will be stabilized in PACU and transferred upstairs for convalescence.  The C-arm was utilized throughout entire case for spot films and radiographic assistance in reduction.  To help with the patient positioning, prepping, draping, technical or surgical assistant throughout the entire case, wound closure, application, dressing, and splint Mr. Arlyn Leak, PA, assistance  was needed throughout the entire case.          ______________________________ Erasmo Leventhal, M.D.     RAC/MEDQ  D:  09/25/2012  T:  09/25/2012  Job:  [id number]

## 2012-09-26 NOTE — Progress Notes (Signed)
Clinical Social Work Department CLINICAL SOCIAL WORK PLACEMENT NOTE 09/26/2012  Patient:  Chelsea Thornton,Chelsea Thornton  Account Number:  [id number] Admit date:  09/23/2012  Clinical Social Worker:  Cori Razor, LCSW  Date/time:  09/26/2012 05:00 PM  Clinical Social Work is seeking post-discharge placement for this patient at the following level of care:   SKILLED NURSING   (*CSW will update this form in Epic as items are completed)   09/26/2012  Patient/family provided with [facility] Department of Clinical Social Work's list of facilities offering this level of care within the geographic area requested by the patient (or if unable, by the patient's family).  09/26/2012  Patient/family informed of their freedom to choose among providers that offer the needed level of care, that participate in Medicare, Medicaid or managed care program needed by the patient, have an available bed and are willing to accept the patient.  09/26/2012  Patient/family informed of [facility]' ownership interest in [facility], as well as of the fact that they are under no obligation to receive care at this facility.  PASARR submitted to EDS on 09/26/2012 PASARR number received from E[employer] on 09/26/2012  FL2 transmitted to all facilities in geographic area requested by pt/family on  09/26/2012 FL2 transmitted to all facilities within larger geographic area on   Patient informed that his/her managed care company has contracts with or will negotiate with  certain facilities, including the following:     Patient/family informed of bed offers received:  09/26/2012 Patient chooses bed at  Physician recommends and patient chooses bed at    Patient to be transferred to  on   Patient to be transferred to facility by   The following physician request were entered in Epic:   Additional Comments: Pt has [employer] PPO insurance. Prior authorization is required for SNF placement.  Cori Razor  LCSW [phone]

## 2012-09-26 NOTE — Progress Notes (Signed)
Subjective: Patient is wakes up up this morning excess is not in any distress extreme distress she has some achiness and discomfort in her knee from time to time. She denies any shortness of breath no chest pains no nausea or vomiting she crackers and drinking soda a fairly easily   Objective: Vital signs in last 24 hours: Temp:  [97.4 F (36.3 C)-99.6 F (37.6 C)] 98.1 F (36.7 C) (02/17 0456) Pulse Rate:  [77-101] 101 (02/17 0456) Resp:  [12-23] 18 (02/17 0456) BP: (132-188)/(52-85) 148/74 mmHg (02/17 0456) SpO2:  [83 %-100 %] 94 % (02/17 0456)  Intake/Output from previous day: 02/16 0701 - 02/17 0700 In: 1853 [I.V.:1853] Out: 3488 [Urine:3400; Drains:88] Intake/Output this shift: Total I/O In: 0  Out: 1280 [Urine:1200; Drains:80]   Recent Labs  09/24/12 0045 09/25/12 0428 09/26/12 0425  HGB 10.0* 10.5* 10.8*    Recent Labs  09/25/12 0428 09/26/12 0425  WBC 8.8 11.4*  RBC 3.85* 3.89  HCT 32.4* 32.3*  PLT 528* 584*    Recent Labs  09/25/12 0428 09/26/12 0425  NA 135 132*  K 3.9 4.2  CL 98 95*  CO2 29 28  BUN 7 7  CREATININE 0.35* 0.34*  GLUCOSE 156* 166*  CALCIUM 8.6 8.7    Recent Labs  09/24/12 0045  INR 1.01    Patient is conscious alert appropriate sitting up appears to be very comfortable in no distress. Her pulses are palpable and regular at her wrist. Her toes are warm good sensation good motion of the ankle her calf is soft and slightly sore. Her Hemovac is intact dressing is intact without any bleedthrough she has no thigh pain  Assessment/Plan: Postop day #1 ORIF of tibial plateau fracture with extreme comminution and osteoporotic bone  Plan as discussed with the patient due to her soft osteoporotic bone her comminution of the fracture she is going to be in a long-leg immobilizer and nonweightbearing for 4-6 weeks. We will allow a few weeks prior to placing the patient and her CPM for motion of the knee to allow some healing due to the  extreme comminution and osteoporotic bone. She will need skilled nursing placement and order for social worker in place. We will DC the Hemovac later today or tomorrow morning along with dressing change. Patient does well from the orthopedic standpoint she'll be ready for skilled nursing placement on Tuesday afternoon or Wednesday. At her current status her pain is well controlled   Jamelle Rushing 09/26/2012, 6:52 AM

## 2012-09-26 NOTE — Evaluation (Signed)
Physical Therapy Evaluation Patient Details Name: Chelsea Thornton MRN: [id number] DOB: 31-Aug-1950 Today's Date: 09/26/2012 Time: 1040-1100 PT Time Calculation (min): 20 min  PT Assessment / Plan / Recommendation Clinical Impression  62 y.o. female admitted for L tibial plateau fx, s/p ORIF.  Pt is NWB with KI at all times. Pt requires +2 total assist for bed to chair transfer with RW. SNF recommended. She would benefit from acute PT to maximize safety and independence with mobility.     PT Assessment  Patient needs continued PT services    Follow Up Recommendations  SNF    Does the patient have the potential to tolerate intense rehabilitation      Barriers to Discharge        Equipment Recommendations  None recommended by PT    Recommendations for Other Services OT consult   Frequency Min 3X/week    Precautions / Restrictions Precautions Required Braces or Orthoses: Knee Immobilizer - Left Knee Immobilizer - Left: On at all times Restrictions Weight Bearing Restrictions: Yes Other Position/Activity Restrictions: NWB LLE   Pertinent Vitals/Pain **9/10 LLE at rest and with activity Premedicated, BLEs elevated*      Mobility  Bed Mobility Bed Mobility: Supine to Sit Supine to Sit: 3: Mod assist;HOB flat Details for Bed Mobility Assistance: assist to elevate trunk and support LLE Transfers Transfers: Sit to Stand;Stand to Sit;Stand Pivot Transfers Sit to Stand: 1: +2 Total assist Sit to Stand: Patient Percentage: 30% Stand to Sit: 1: +2 Total assist Stand to Sit: Patient Percentage: 40% Stand Pivot Transfers: 1: +2 Total assist Stand Pivot Transfers: Patient Percentage: 40% Details for Transfer Assistance: Assist to rise, pt flexed neck/trunk in standing with RW, assist to pivot with RW to recliner, VCs NWB status, increased time/effort for pivot transfer Ambulation/Gait Ambulation/Gait Assistance: Not tested (comment)    Exercises     PT Diagnosis: Difficulty  walking;Abnormality of gait;Generalized weakness;Acute pain  PT Problem List: Decreased strength;Decreased activity tolerance;Pain;Decreased mobility PT Treatment Interventions: Gait training;DME instruction;Functional mobility training;Therapeutic exercise;Therapeutic activities;Patient/family education   PT Goals Acute Rehab PT Goals PT Goal Formulation: With patient Time For Goal Achievement: 10/10/12 Potential to Achieve Goals: Fair Pt will go Supine/Side to Sit: with min assist;with HOB 0 degrees PT Goal: Supine/Side to Sit - Progress: Goal set today Pt will go Sit to Stand: with min assist PT Goal: Sit to Stand - Progress: Goal set today Pt will go Stand to Sit: with min assist PT Goal: Stand to Sit - Progress: Goal set today Pt will Ambulate: 1 - 15 feet;with min assist PT Goal: Ambulate - Progress: Goal set today  Visit Information  Last PT Received On: 09/26/12 Assistance Needed: +2    Subjective Data  Subjective: I don't think I can do it (stand up).  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    Copywriter, advertising Overall Cognitive Status: Appears within functional limits for tasks assessed/performed Arousal/Alertness: Awake/alert Orientation Level: Appears intact for tasks assessed Behavior During Session: [facility] for tasks performed    Extremity/Trunk Assessment Right Upper Extremity  Assessment RUE ROM/Strength/Tone: [facility] for tasks  assessed Left Upper Extremity Assessment LUE ROM/Strength/Tone: WFL for tasks assessed Right Lower Extremity Assessment RLE ROM/Strength/Tone: Within functional levels RLE Sensation: WFL - Light Touch RLE Coordination: WFL - gross/fine motor Left Lower Extremity Assessment LLE ROM/Strength/Tone: Unable to fully assess;Due to precautions;Due to pain (KI on at all times, can wiggle toes, -2/5 hip) Trunk Assessment Trunk Assessment: Kyphotic   Balance    End of Session PT - End of Session Equipment Utilized During Treatment: Gait belt;Left knee immobilizer Activity Tolerance: Patient limited by pain;Patient limited by fatigue Patient left: in chair;with call bell/phone within reach Nurse Communication: Mobility status  GP     Ralene Bathe Kistler 09/26/2012, 11:11 AM [phone]

## 2012-09-26 NOTE — Progress Notes (Signed)
Clinical Social Work Department BRIEF PSYCHOSOCIAL ASSESSMENT 09/26/2012  Patient:  Chelsea Thornton,Chelsea Thornton     Account Number:  [id number]     Admit date:  09/23/2012  Clinical Social Worker:  Candie Chroman  Date/Time:  09/26/2012 04:56 PM  Referred by:  Physician  Date Referred:  09/25/2012 Referred for  SNF Placement   Other Referral:   Interview type:  Patient Other interview type:    PSYCHOSOCIAL DATA Living Status:  ALONE Admitted from facility:   Level of care:   Primary support name:  Chelsea Thornton Primary support relationship to patient:  SIBLING Degree of support available:   supportive    CURRENT CONCERNS Current Concerns  Post-Acute Placement   Other Concerns:    SOCIAL WORK ASSESSMENT / PLAN Pt is a 62 yr old female living at home prior to hospitalization. CSW met with pt to assist with d/c planning . ST SNF is needed following hospital d/c. Pt is in agreement with d/c plan. SNF search has been initiated . Bed offers will be provided as received. At this time only 1 facility has responded. Calls made to additional SNF's . Awaiting response.   Assessment/plan status:  Psychosocial Support/Ongoing Assessment of Needs Other assessment/ plan:   Information/referral to community resources:   SNF list provided.    PATIENT'S/FAMILY'S RESPONSE TO PLAN OF CARE: Pt would like her sister to make her SNF choice. CSW has requested pt/sister consider SNF that has made offer. Pt / sister will discuss this tonite. CSW will follow up in am.   Cori Razor LCSW [phone]

## 2012-09-27 ENCOUNTER — Encounter (HOSPITAL_COMMUNITY): Payer: Self-pay | Admitting: Specialist

## 2012-09-27 DIAGNOSIS — K5900 Constipation, unspecified: Secondary | ICD-10-CM

## 2012-09-27 DIAGNOSIS — D72829 Elevated white blood cell count, unspecified: Secondary | ICD-10-CM | POA: Diagnosis not present

## 2012-09-27 LAB — BASIC METABOLIC PANEL
CO2: 29 mEq/L (ref 19–32)
Calcium: 8.3 mg/dL — ABNORMAL LOW (ref 8.4–10.5)
Creatinine, Ser: 0.4 mg/dL — ABNORMAL LOW (ref 0.50–1.10)
Glucose, Bld: 177 mg/dL — ABNORMAL HIGH (ref 70–99)

## 2012-09-27 LAB — CBC
MCH: 28.1 pg (ref 26.0–34.0)
MCHC: 33.3 g/dL (ref 30.0–36.0)
MCV: 84.2 fL (ref 78.0–100.0)
Platelets: 504 10*3/uL — ABNORMAL HIGH (ref 150–400)
RDW: 12.9 % (ref 11.5–15.5)

## 2012-09-27 LAB — GLUCOSE, CAPILLARY: Glucose-Capillary: 177 mg/dL — ABNORMAL HIGH (ref 70–99)

## 2012-09-27 MED ORDER — POTASSIUM CHLORIDE CRYS ER 20 MEQ PO TBCR
40.0000 meq | EXTENDED_RELEASE_TABLET | Freq: Once | ORAL | Status: AC
  Administered 2012-09-27: 40 meq via ORAL
  Filled 2012-09-27: qty 2

## 2012-09-27 MED ORDER — MORPHINE SULFATE 2 MG/ML IJ SOLN: 2.0000 mg | INTRAMUSCULAR | Status: DC | PRN

## 2012-09-27 MED ORDER — SORBITOL 70 % SOLN
960.0000 mL | TOPICAL_OIL | Freq: Once | ORAL | Status: AC
  Administered 2012-09-27: 960 mL via RECTAL
  Filled 2012-09-27 (×2): qty 240

## 2012-09-27 MED ORDER — INSULIN GLARGINE 100 UNIT/ML ~~LOC~~ SOLN
10.0000 [IU] | Freq: Every day | SUBCUTANEOUS | Status: DC
  Administered 2012-09-27: 10 [IU] via SUBCUTANEOUS

## 2012-09-27 MED ORDER — SORBITOL 70 % SOLN
960.0000 mL | TOPICAL_OIL | Freq: Once | ORAL | Status: DC
  Filled 2012-09-27: qty 240

## 2012-09-27 MED ORDER — SODIUM CHLORIDE 0.9 % IV SOLN
INTRAVENOUS | Status: DC
  Administered 2012-09-27 – 2012-09-28 (×2): via INTRAVENOUS

## 2012-09-27 MED ORDER — BISACODYL 10 MG RE SUPP
10.0000 mg | Freq: Every day | RECTAL | Status: DC | PRN
  Administered 2012-09-27: 10 mg via RECTAL
  Filled 2012-09-27: qty 1

## 2012-09-27 NOTE — Progress Notes (Signed)
Subjective: Patient feeling better today she still sore in her left knee but it is not as bad no other complaints.   Objective: Vital signs in last 24 hours: Temp:  [98.7 F (37.1 C)-99.1 F (37.3 C)] 99.1 F (37.3 C) (02/17 2144) Pulse Rate:  [83-98] 85 (02/17 2144) Resp:  [16-19] 16 (02/18 0435) BP: (102-110)/(65-67) 110/65 mmHg (02/17 2144) SpO2:  [92 %-98 %] 93 % (02/18 0435)  Intake/Output from previous day: 02/17 0701 - 02/18 0700 In: 2061.1 [P.O.:660; I.V.:1401.1] Out: 802 [Urine:800; Drains:2] Intake/Output this shift: Total I/O In: 597.3 [P.O.:300; I.V.:297.3] Out: 102 [Urine:100; Drains:2]   Recent Labs  09/25/12 0428 09/26/12 0425 09/27/12 0510  HGB 10.5* 10.8* 10.1*    Recent Labs  09/26/12 0425 09/27/12 0510  WBC 11.4* 10.6*  RBC 3.89 3.60*  HCT 32.3* 30.3*  PLT 584* 504*    Recent Labs  09/26/12 0425 09/27/12 0510  NA 132* 135  K 4.2 3.5  CL 95* 96  CO2 28 29  BUN 7 12  CREATININE 0.34* 0.40*  GLUCOSE 166* 177*  CALCIUM 8.7 8.3*   No results found for this basename: LABPT, INR,  in the last 72 hours  Patient is conscious alert appropriate appears to be in no distress up in bed. Her left leg knee immobilizer open up dressing was taken down which is clean and dry the wound was well approximate Steri-Strips no erythema no pressure blisters no tense swelling in the calf or around the wound her Hemovac drain was DC'd intact her leg is neuromotor vascularly intact at the foot.  Assessment/Plan: Postop day #2 status post ORIF of comminuted medial tibial plateau fracture stable  Plan at this time are to continue absolute nonweightbearing left lower extremity must be in the immobilizer all times may be opened up for hygiene. Dressing to be changed daily. She is to continue bed to chair only. No range of motion of the knee. A waiting skilled nursing placement. When skilled nursing available a followup with Dr. Thomasena Edis in 2-3 weeks in our office call  [phone] for a followup appointment. During that 2-3 weeks knee immobilizer all the time except for hygiene no range of motion of the knee and absolute nonweightbearing of her left lower extremity with transfers. We'll continue to follow and monitor while she is in a hospital   Naheim Burgen W 09/27/2012, 6:53 AM

## 2012-09-27 NOTE — Care Management Note (Signed)
    Page 1 of 1   09/27/2012     4:35:39 PM   CARE MANAGEMENT NOTE 09/27/2012  Patient:  MACEE, VENABLES   Account Number:  [id number]  Date Initiated:  09/26/2012  Documentation initiated by:  Colleen Can  Subjective/Objective Assessment:   DX DISPLACED TIBIAL PLATEAU FRACTURE; ORIF , INTERNAL FIXATION     Action/Plan:   PT STATES SHE IS FROM HOME AND LIVES ALONE. STATES SHE IS TOTALLY NON WT BEARING. WANTS TO GET RECOMMENDATIONS FROM HER DOCTOR REGARDING DISCHARGE PLANS. PHYSICAL THERAPY HAS RECOMMENDED SNF REHAB   Anticipated DC Date:  09/28/2012   Anticipated DC Plan:  SKILLED NURSING FACILITY  In-house referral  Clinical Social Worker      DC Planning Services  CM consult      Choice offered to / List presented to:             Status of service:  Completed, signed off Medicare Important Message given?   (If response is "NO", the following Medicare IM given date fields will be blank) Date Medicare IM given:   Date Additional Medicare IM given:    Discharge Disposition:    Per UR Regulation:    If discussed at Long Length of Stay Meetings, dates discussed:    Comments:  09/27/2012 [facility] Ginna Schuur BSN RN CCM [phone] CM WILL AS NEEDED.

## 2012-09-27 NOTE — Progress Notes (Signed)
PT Cancellation Note  ___Treatment cancelled today due to medical issues with patient which prohibited therapy  ___ Treatment cancelled today due to patient receiving procedure or test   _X_ Treatment cancelled today due to patient's refusal to participate .Marland Kitchen...stating "I'm not doing any therapy today !!"  Pt having bowel issues  ___ Treatment cancelled today due to  Felecia Shelling  PTA [facility]  Acute  Rehab Pager      [phone]

## 2012-09-27 NOTE — Progress Notes (Signed)
Pt has chosen [facility] for ST SNF placement. SNF is working with [employer] for authorization. CSW will continue to follow to assist with d/c planning.  Cori Razor LCSW [phone]

## 2012-09-27 NOTE — Progress Notes (Signed)
[facility] PROGRESS NOTE  Lizbett Garciagarcia NWG:[id number] DOB: 06-09-1951 DOA: 09/23/2012 PCP: [name], MD  Assessment/Plan:  #1 left tibial plateau fracture Per CT scan of 09/16/2012 patient with a fracture extending from the lateral tibial eminence through the medial tibial metaphysis with minimal impaction along the medial tibial metaphysis. Associated joint effusion noted. Patient was transferred from [facility] for repair of fracture per her orthopod Dr. Thomasena Edis. Patient with swelling in the left knee. Patient has been seen by orthopedics and patient s/p  ORIF. Patient will likely need nonweightbearing for 4-6 weeks per orthopedics. Pain management. Per orthopedics.  #2 abnormal chest x-ray Patient was being treated around the hospital for community acquired pneumonia secondary to abnormal chest x-ray. Patient denies any respiratory symptoms. Patient denies any fever, no chills, no cough, no congestion. Patient is being followed by Dr. Shelle Iron of [place] [facility] secondary to abnormal chest x-ray findings which have been chronic. Patient was treated with a short course of prednisone with symptomatic improvement and partial clearing per CT per Dr. Teddy Spike note of 04/20/2012. Was recommended that patient be monitored off steroids with close followup with Dr. Shelle Iron as outpatient. Patient states she's been asymptomatic and is to see Dr. Shelle Iron in March for further evaluation. Patient has been treated with 5 days of antibiotics at [facility]. Antibiotics were discontinued. Patient will need to followup as outpatient with Dr. Shelle Iron.  #3 diabetes mellitus Hemoglobin A1c is 9.8. CBGs 177-234. Hold oral hypoglycemic agents. Increase Lantus to 10  units daily. Continue sliding scale insulin. Follow.  #4 urinary retention Continue Foley catheter. Continue Flomax. Will also treat constipation. Once constipation is resolved may consider voiding trial if patient is still in house.  Will likely need outpatient followup with urology for voiding trial. Follow.  #5 anemia of chronic disease No overt GI bleed. Anemia panel consistent with anemia of chronic disease.  Follow H&H.  #6 constipation Patient with no BM after MiraLAX and Senokot and sorbitol. Enema has been placed today. Will follow.  #7 leukocytosis Likely reactive secondary to recent surgery. Patient with no upper respiratory symptoms. Will check a UA with cultures and sensitivities. Follow.  #8 prophylaxis Lovenox for DVT prophylaxis.  Code Status: Full Family Communication: Updated patient at bedside. No family present. Disposition Plan: SNF when medically stable   Consultants:  Orthopedic Dr. Victorino Dike 09/24/2012  Procedures:  Chest x-ray 09/24/2012  ORIF left tibial plateau 09/25/2012 per Dr. Thomasena Edis  Antibiotics:  Levaquin 09/23/2012 to 09/24/2012  HPI/Subjective: Patient denies any chest pain. Patient denies any shortness of breath. Patient denies any cough, no fever, no chills, no other associated symptoms. Patient complaining of left knee pain.  Objective: Filed Vitals:   09/26/12 2144 09/27/12 0052 09/27/12 0435 09/27/12 0711  BP: 110/65   120/69  Pulse: 85   78  Temp: 99.1 F (37.3 C)   98.6 F (37 C)  TempSrc: Oral   Oral  Resp: 18 16 16 16   Height:      Weight:      SpO2: 94% 92% 93% 94%    Intake/Output Summary (Last 24 hours) at 09/27/12 1037 Last data filed at 09/27/12 5784  Gross per 24 hour  Intake 957.34 ml  Output   1352 ml  Net -394.66 ml   Filed Weights   09/23/12 2108  Weight: 95.255 kg (210 lb)    Exam:   General:  NAD  Cardiovascular: RRR, no m/r/g  Respiratory: Clear to auscultation bilaterally. No wheezes, no crackles, no rhonchi.  Abdomen: Soft, nontender, nondistended, positive bowel sounds.  Extremities: No clubbing cyanosis or edema. Left lower extremity in knee immobilizer.   Data Reviewed: Basic Metabolic Panel:  Recent Labs Lab  09/24/12 0045 09/25/12 0428 09/26/12 0425 09/27/12 0510  NA 134* 135 132* 135  K 3.8 3.9 4.2 3.5  CL 98 98 95* 96  CO2 29 29 28 29   GLUCOSE 226* 156* 166* 177*  BUN 7 7 7 12   CREATININE 0.37* 0.35* 0.34* 0.40*  CALCIUM 8.1* 8.6 8.7 8.3*   Liver Function Tests: No results found for this basename: AST, ALT, ALKPHOS, BILITOT, PROT, ALBUMIN,  in the last 168 hours No results found for this basename: LIPASE, AMYLASE,  in the last 168 hours No results found for this basename: AMMONIA,  in the last 168 hours CBC:  Recent Labs Lab 09/24/12 0045 09/25/12 0428 09/26/12 0425 09/27/12 0510  WBC 8.4 8.8 11.4* 10.6*  HGB 10.0* 10.5* 10.8* 10.1*  HCT 29.7* 32.4* 32.3* 30.3*  MCV 82.7 84.2 83.0 84.2  PLT 474* 528* 584* 504*   Cardiac Enzymes: No results found for this basename: CKTOTAL, CKMB, CKMBINDEX, TROPONINI,  in the last 168 hours BNP (last 3 results) No results found for this basename: PROBNP,  in the last 8760 hours CBG:  Recent Labs Lab 09/26/12 0727 09/26/12 1202 09/26/12 1738 09/26/12 2128 09/27/12 0735  GLUCAP 187* 192* 234* 200* 177*    Recent Results (from the past 240 hour(s))  SURGICAL PCR SCREEN     Status: None   Collection Time    09/25/12  9:59 AM      Result Value Range Status   MRSA, PCR NEGATIVE  NEGATIVE Final   Staphylococcus aureus NEGATIVE  NEGATIVE Final   Comment:            The Xpert SA Assay (FDA     approved for NASAL specimens     in patients over 28 years of age),     is one component of     a comprehensive surveillance     program.  Test performance has     been validated by [employer] for patients greater     than or equal to 51 year old.     It is not intended     to diagnose infection nor to     guide or monitor treatment.     Studies: Dg C-arm 61-120 Min-no Report  09/25/2012  CLINICAL DATA: left tibial plateau fracture   C-ARM 61-120 MINUTES  Fluoroscopy was utilized by the requesting physician.  No radiographic   interpretation.     Dg Knee 2 Views Left  09/25/2012  *RADIOLOGY REPORT*  Clinical Data: Left tibial plateau fracture, ORIF.  LEFT KNEE - 3 VIEW  Comparison: 09/19/2012  Findings: Two intraoperative spot images demonstrate plate and screw fixation of the proximal tibial fracture.  Near anatomic alignment.  No hardware or bony complicating feature.  IMPRESSION: Internal fixation of the proximal tibial fracture.   Original Report Authenticated By: Charlett Nose, M.D.     Scheduled Meds: . acetaminophen  500 mg Oral Q8H  . docusate sodium  100 mg Oral BID  . enoxaparin (LOVENOX) injection  30 mg Subcutaneous Q12H  . insulin aspart  0-15 Units Subcutaneous TID WC  . insulin glargine  5 Units Subcutaneous QHS  . polyethylene glycol  17 g Oral BID  . senna-docusate  1 tablet Oral QHS  . sodium chloride  3 mL Intravenous  Q12H  . Tamsulosin HCl  0.4 mg Oral QPC supper   Continuous Infusions:   Principal Problem:   Knee injury Active Problems:   Alveolar pneumopathy   Acute respiratory failure with hypoxia   DM (diabetes mellitus)   Urinary retention   Anemia   Abnormal CXR   Left tibial fracture   Unspecified constipation    Time spent: > 35 mins    [facility]  [facility] Pager [phone]. If 8PM-8AM, please contact night-coverage at [email or web address], password [facility] 09/27/2012, 10:37 AM  LOS: 4 days

## 2012-09-28 DIAGNOSIS — J8409 Other alveolar and parieto-alveolar conditions: Secondary | ICD-10-CM

## 2012-09-28 LAB — BASIC METABOLIC PANEL
BUN: 8 mg/dL (ref 6–23)
Chloride: 98 mEq/L (ref 96–112)
Glucose, Bld: 197 mg/dL — ABNORMAL HIGH (ref 70–99)
Potassium: 4.1 mEq/L (ref 3.5–5.1)

## 2012-09-28 LAB — GLUCOSE, CAPILLARY
Glucose-Capillary: 203 mg/dL — ABNORMAL HIGH (ref 70–99)
Glucose-Capillary: 219 mg/dL — ABNORMAL HIGH (ref 70–99)
Glucose-Capillary: 236 mg/dL — ABNORMAL HIGH (ref 70–99)

## 2012-09-28 LAB — URINALYSIS, ROUTINE W REFLEX MICROSCOPIC
Bilirubin Urine: NEGATIVE
Glucose, UA: NEGATIVE mg/dL
Nitrite: NEGATIVE
Protein, ur: NEGATIVE mg/dL
pH: 8 (ref 5.0–8.0)

## 2012-09-28 LAB — CBC
HCT: 30.8 % — ABNORMAL LOW (ref 36.0–46.0)
Hemoglobin: 10.1 g/dL — ABNORMAL LOW (ref 12.0–15.0)
MCH: 27.4 pg (ref 26.0–34.0)
MCHC: 32.8 g/dL (ref 30.0–36.0)
MCV: 83.5 fL (ref 78.0–100.0)

## 2012-09-28 MED ORDER — INSULIN GLARGINE 100 UNIT/ML ~~LOC~~ SOLN
15.0000 [IU] | Freq: Every day | SUBCUTANEOUS | Status: DC
  Administered 2012-09-29: 15 [IU] via SUBCUTANEOUS

## 2012-09-28 MED ORDER — ENOXAPARIN SODIUM 40 MG/0.4ML ~~LOC~~ SOLN
40.0000 mg | SUBCUTANEOUS | Status: DC
  Administered 2012-09-29: 40 mg via SUBCUTANEOUS
  Filled 2012-09-28 (×2): qty 0.4

## 2012-09-28 NOTE — Progress Notes (Signed)
Subjective: Patient's left leg is doing well she states it still little sore. She was extremely constipated yesterday and she was given medications for constipation she has diarrhea she is well known to me immobilizers over the last 24 hours of is no diarrhea. She denies any other complaints.   Objective: Vital signs in last 24 hours: Temp:  [98.4 F (36.9 C)-98.6 F (37 C)] 98.4 F (36.9 C) (02/18 2120) Pulse Rate:  [78-90] 90 (02/18 2120) Resp:  [16] 16 (02/18 2120) BP: (118-122)/(66-69) 122/69 mmHg (02/18 2120) SpO2:  [94 %-96 %] 96 % (02/18 2120)  Intake/Output from previous day: 02/18 0701 - 02/19 0700 In: 3045 [P.O.:600; I.V.:2445] Out: 1850 [Urine:1850] Intake/Output this shift: Total I/O In: 2320 [I.V.:2320] Out: 400 [Urine:400]   Recent Labs  09/26/12 0425 09/27/12 0510 09/28/12 0510  HGB 10.8* 10.1* 10.1*    Recent Labs  09/27/12 0510 09/28/12 0510  WBC 10.6* 12.8*  RBC 3.60* 3.69*  HCT 30.3* 30.8*  PLT 504* 571*    Recent Labs  09/26/12 0425 09/27/12 0510  NA 132* 135  K 4.2 3.5  CL 95* 96  CO2 28 29  BUN 7 12  CREATININE 0.34* 0.40*  GLUCOSE 166* 177*  CALCIUM 8.7 8.3*   No results found for this basename: LABPT, INR,  in the last 72 hours  Patient is conscious alert and appropriate she appears to be comfortable she's been cleaned up by the nurse. Her left knee dressing was clean dressing was changed her wound full approximated Steri-Strips no signs of infection no drainage she is still very tender around the medial tibial plateau where Is basically soft nontender her leg is neuromotor vascularly intact  Assessment/Plan: Postop day #3 ORIF comminuted medial tibial plateau fracture left knee with allograft bone matrix augmentation doing well. Diarrhea Plan patient will continue to be totally nonweightbearing left lower extremity with bed to chair transfers only no range of motion. Patient is to have a knee immobilizer on at all times except for  when she is bathing. Will continue with DVT prophylaxis of Lovenox convert her over to 40 mg once a day until she is more mobile while probably 3-4 weeks. Transition her from the SCDs 2 plexi pulse foot pumps for DVT prophylaxis as it is SCDs on her left leg would be quite uncomfortable due to her fracture site. Recommend about a 3 week followup appointment with Dr. Thomasena Edis in his office [phone] for that appointment. Will dictate a brief orthopedic discharge summary with instructions   Chelsea Thornton 09/28/2012, 6:53 AM

## 2012-09-28 NOTE — Progress Notes (Signed)
Physical Therapy Treatment Patient Details Name: Chelsea Thornton MRN: [id number] DOB: [birth year] Today's Date: 09/28/2012 Time: 5621-3086 PT Time Calculation (min): 28 min  PT Assessment / Plan / Recommendation Comments on Treatment Session  pt progressing slowly; needs max encouragement to participate but willing and puts forth good effort once encouraged; will  benefit from STSNF to allow pt to return to prior level of function;     Follow Up Recommendations  SNF;Supervision/Assistance - 24 hour     Does the patient have the potential to tolerate intense rehabilitation     Barriers to Discharge        Equipment Recommendations  None recommended by PT    Recommendations for Other Services    Frequency Min 3X/week   Plan Discharge plan remains appropriate;Frequency remains appropriate    Precautions / Restrictions Precautions Precautions: Fall Precaution Comments: NO ROM LEFT KNEE Required Braces or Orthoses: Knee Immobilizer - Left Knee Immobilizer - Left: On at all times Restrictions Weight Bearing Restrictions: Yes LLE Weight Bearing: Non weight bearing Other Position/Activity Restrictions: NWB LLE   Pertinent Vitals/Pain  Pain controlled per pt   Mobility  Bed Mobility Bed Mobility: Supine to Sit;Sitting - Scoot to Edge of Bed Supine to Sit: 3: Mod assist;HOB flat Sitting - Scoot to [place] of Bed: 4: Min assist Details for Bed Mobility Assistance: assist to elevate trunk and support LLE; cues for technique, hand placement, self assist Transfers Transfers: Sit to Stand;Stand to Sit;Stand Pivot Transfers Sit to Stand: 1: +2 Total assist Sit to Stand: Patient Percentage: 30% Stand to Sit: 1: +2 Total assist Stand to Sit: Patient Percentage: 40% Stand Pivot Transfers: 1: +2 Total assist Stand Pivot Transfers: Patient Percentage: 30% Details for Transfer Assistance: 2 attempts to stand, bed ht elevated, pt with flexed trunk in standing with RW, assist to pivot with RW  to recliner, multi-modal cues for NWB,  increased time/effort for pivot transfer Ambulation/Gait Ambulation/Gait Assistance: Not tested (comment)    Exercises Total Joint Exercises Ankle Circles/Pumps: AROM;AAROM;Both;10 reps   PT Diagnosis:    PT Problem List:   PT Treatment Interventions:     PT Goals Acute Rehab PT Goals Time For Goal Achievement: 10/10/12 Potential to Achieve Goals: Fair Pt will go Supine/Side to Sit: with min assist;with HOB 0 degrees PT Goal: Supine/Side to Sit - Progress: Progressing toward goal Pt will go Sit to Stand: with min assist PT Goal: Sit to Stand - Progress: Progressing toward goal Pt will go Stand to Sit: with min assist PT Goal: Stand to Sit - Progress: Progressing toward goal  Visit Information  Last PT Received On: 09/28/12 Assistance Needed: +2    Subjective Data  Subjective: I can't, I wish you would wait.   Cognition  Cognition Overall Cognitive Status: Appears within functional limits for tasks assessed/performed Arousal/Alertness: Awake/alert Orientation Level: Appears intact for tasks assessed Behavior During Session: Anxious Cognition - Other Comments: pt with great deal of anxiety regarding mobility, WBing, falls    Balance     End of Session PT - End of Session Equipment Utilized During Treatment: Gait belt;Left knee immobilizer Activity Tolerance: Patient limited by pain;Patient limited by fatigue (pt also with diarrhea this am; makeshift depends on,RN aware) Patient left: in chair;with call bell/phone within reach Nurse Communication: Mobility status;Need for lift equipment (maxi-move for back to bed )   GP     [facility] 09/28/2012, 11:07 AM

## 2012-09-28 NOTE — Discharge Summary (Signed)
  Admission diagnoses left knee medial plateau comminuted displaced tibial plateau fracture  Discharge diagnosis left knee ORIF of comminuted displaced tibial plateau fracture with severely osteoporotic bone augmented with allograft bone matrix.  History. Patient is well-known to Dr. Thomasena Edis she's been been evaluated and treated for a left knee pain she was known to have a nondisplaced medial tibial plateau injury stress fracture patient continued to ambulate place weight on the fracture site did progress that collapsed she was admitted around all hospital for evaluation she was treated for pneumonia and a knee fracture. Patient was medically stabilized and was transferred to [facility] and medical services were was she was stopped with treatment for pneumonia and she was preop'd for ORIF of her left displaced tibial plateau fracture. Patient was taken to the OR and ORIF was performed without complications and she was transferred back to the orthopedic floor for postoperative care. Patient had no medical untoward events respiratory status remained in good patient's current chronic COPD status. Patient's left knee wound remained benign for any signs of infection her leg remain neuromotor vascularly intact. Still that due to her injury osteoporotic bone she's been remaining nonweightbearing no range of motion of her left knee until further notice. Recommendations for skilled nursing facility placement.  Discharge instructions Lovenox 40 mg once a day for DVT prophylaxis until weightbearing decrease risk of DVT PlexiPulse foot pumps while in bed with thigh high Ted stocking Total nonweightbearing left lower extremity Knee immobilizer on left lower extremity unless bathing and then no valgus or varus stressing of the knee. Daily dressing changes   Followup appointment with Dr. Thomasena Edis call [phone] for an appointment in 3 weeks.

## 2012-09-28 NOTE — Progress Notes (Signed)
CSW assisting with d/c planning. [facility] has received prior Authorization from [facility] for SNF placement. Possible d/c to SNF in the am. CSW will continue to follow to assist with d/c planning to SNF.  Cori Razor LCSW [phone]

## 2012-09-28 NOTE — Progress Notes (Signed)
Patient ID: Chelsea Thornton, female   DOB: Nov 16, 1950, 62 y.o.   MRN: [id number]  [facility] PROGRESS NOTE  Chelsea Thornton NWG:[id number] DOB: November 23, 1950 DOA: 09/23/2012 PCP: Chelsea Rossetti, MD  Brief narrative: 62 yo female who recently fell and suffered from a tib/fib plateur fracture. Was at home and fell and presented to [facility] and hospitalized for inability to get around due to the fracture. They were going to operate last week but patient refused and wished to be transferred to [facility] to have the surgery. She was dx with pna at  and put on azithro and rocephin. There are minimal records sent from [place], including no discharge summary or MAR. Info is very limited. Dr Chelsea Thornton who is pt routine ortho surgeon agreed to do surgery here at [facility] long so pt was transferred for that reason.  Principal Problem:   Knee injury, left tibial plateau fracture  - Per CT scan of 09/16/2012 patient with a fracture extending from the lateral tibial eminence through the medial tibial metaphysis  - s/p ORIF, continue with pain management - appreciate ortho input  Active Problems:   Alveolar pneumopathy - close follow up with Dr. 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 CBGs 200 - 300 - Increase Lantus to 15 units daily. Continue sliding scale insulin.   Urinary retention - continue to have Foley in place and continue Flomax   Anemia of chronic disease - stable Hg and Hct  - CBC in AM   Leukocytosis, unspecified - Likely reactive secondary to recent surgery.  - Patient with no upper respiratory symptoms.  Consultants:  Ortho  Procedures/Studies:  Chest x-ray  09/24/2012   ORIF left tibial plateau 09/25/2012 per Dr. Thomasena Thornton  Antibiotics:  Levaquin 02/14 --> 02/15  Code Status: Full Family Communication: Pt at bedside Disposition Plan: SNF likely in AM  HPI/Subjective: No events overnight.   Objective: Filed Vitals:   09/27/12 0711 09/27/12 1400 09/27/12 2120 09/28/12 0658  BP: 120/69 118/66 122/69 117/72  Pulse: 78 87 90 80  Temp: 98.6 F (37 C) 98.6 F (37 C) 98.4 F (36.9 C) 98.6 F (37 C)  TempSrc: Oral  Oral Oral  Resp: 16 16 16 16   Height:      Weight:      SpO2: 94% 94% 96% 95%    Intake/Output Summary (Last 24 hours) at 09/28/12 1214 Last data filed at 09/28/12 0654  Gross per 24 hour  Intake   2685 ml  Output   2600 ml  Net     85 ml    Exam:   General:  Pt is alert, follows commands appropriately, not in acute distress  Cardiovascular: Regular rate and rhythm, S1/S2, no murmurs, no rubs, no gallops  Respiratory: Clear to auscultation bilaterally, no wheezing, no crackles, no rhonchi  Abdomen: Soft, non tender, non distended, bowel sounds present, no guarding  Extremities: No edema, pulses DP and PT palpable bilaterally  Neuro: Grossly nonfocal  Data Reviewed: Basic Metabolic Panel:  Recent Labs Lab 09/24/12 0045 09/25/12 0428 09/26/12 0425 09/27/12 0510 09/28/12 0510  NA 134* 135 132* 135 134*  K 3.8 3.9 4.2 3.5 4.1  CL 98 98 95* 96 98  CO2 29  29 28 29 28   GLUCOSE 226* 156* 166* 177* 197*  BUN 7 7 7 12 8   CREATININE 0.37* 0.35* 0.34* 0.40* 0.32*  CALCIUM 8.1* 8.6 8.7 8.3* 8.3*   CBC:  Recent Labs Lab 09/24/12 0045 09/25/12 0428 09/26/12 0425 09/27/12 0510 09/28/12 0510  WBC 8.4 8.8 11.4* 10.6* 12.8*  HGB 10.0* 10.5* 10.8* 10.1* 10.1*  HCT 29.7* 32.4* 32.3* 30.3* 30.8*  MCV 82.7 84.2 83.0 84.2 83.5  PLT 474* 528* 584* 504* 571*   BNP: No components found with this basename: POCBNP,  CBG:  Recent Labs Lab 09/27/12 1129 09/27/12 1543 09/27/12 2317 09/28/12 0730  09/28/12 1147  GLUCAP 246* 194* 227* 203* 219*    Recent Results (from the past 240 hour(s))  SURGICAL PCR SCREEN     Status: None   Collection Time    09/25/12  9:59 AM      Result Value Range Status   MRSA, PCR NEGATIVE  NEGATIVE Final   Staphylococcus aureus NEGATIVE  NEGATIVE Final   Comment:            The Xpert SA Assay (FDA     approved for NASAL specimens     in patients over 90 years of age),     is one component of     a comprehensive surveillance     program.  Test performance has     been validated by [employer] for patients greater     than or equal to 25 year old.     It is not intended     to diagnose infection nor to     guide or monitor treatment.     Scheduled Meds: . acetaminophen  500 mg Oral Q8H  . docusate sodium  100 mg Oral BID  . enoxaparin  injection  40 mg Subcutaneous Q24H  . insulin aspart  0-15 Units Subcutaneous TID WC  . insulin glargine  10 Units Subcutaneous QHS  . polyethylene glycol  17 g Oral BID  . senna-docusate  1 tablet Oral QHS  . Tamsulosin HCl  0.4 mg Oral QPC supper   Continuous Infusions: . sodium chloride 100 mL/hr at 09/28/12 0316     Debbora Presto, MD  [facility] Pager [phone]  If 7PM-7AM, please contact night-coverage [email or web address] Password [email or web address] 09/28/2012, 12:14 PM   LOS: 5 days

## 2012-09-28 NOTE — Progress Notes (Signed)
Inpatient Diabetes Program Recommendations  AACE/ADA: New Consensus Statement on Inpatient Glycemic Control (2013)  Target Ranges:  Prepandial:   less than 140 mg/dL      Peak postprandial:   less than 180 mg/dL (1-2 hours)      Critically ill patients:  140 - 180 mg/dL   Reason for Visit: Hyperglycemia  Results for [name], [name] (MRN [id number]) as of 09/28/2012 15:52  Ref. Range 09/24/2012 00:45  Hemoglobin A1C Latest Range: <5.7 % 9.8 (H)  Results for [name], [name] (MRN [id number]) as of 09/28/2012 15:52  Ref. Range 09/26/2012 17:38 09/26/2012 21:28 09/27/2012 07:35 09/27/2012 11:29 09/27/2012 15:43 09/27/2012 23:17 09/28/2012 07:30 09/28/2012 11:47  Glucose-Capillary Latest Range: 70-99 mg/dL 696 (H) 295 (H) 284 (H) 246 (H) 194 (H) 227 (H) 203 (H) 219 (H)    Inpatient Diabetes Program Recommendations Insulin - Basal: Lantus increased to 15 units QHS to begin tonight Insulin - Meal Coverage: May need small amount of meal coverage insulin when po intake is good - Novolog 3 units tidwc HgbA1C: 9.8% on 09/24/2012 indicates poor glycemic control at home  Note:  Pt may benefit from medication adjustment prior to discharge for better glycemic control.  Will continue to follow.  Thank you. Ailene Ards, RD, LDN, CDE Inpatient Diabetes Coordinator [phone]

## 2012-09-29 LAB — BASIC METABOLIC PANEL
CO2: 27 mEq/L (ref 19–32)
GFR calc non Af Amer: >90 mL/min (ref >90)
Glucose, Bld: 169 mg/dL — ABNORMAL HIGH (ref 70–99)
Potassium: 4 mEq/L (ref 3.5–5.1)
Sodium: 135 mEq/L (ref 135–145)

## 2012-09-29 LAB — CBC
Hemoglobin: 10.1 g/dL — ABNORMAL LOW (ref 12.0–15.0)
MCHC: 33.3 g/dL (ref 30.0–36.0)
RBC: 3.6 MIL/uL — ABNORMAL LOW (ref 3.87–5.11)

## 2012-09-29 LAB — URINE CULTURE: Colony Count: NO GROWTH

## 2012-09-29 LAB — GLUCOSE, CAPILLARY: Glucose-Capillary: 155 mg/dL — ABNORMAL HIGH (ref 70–99)

## 2012-09-29 MED ORDER — INSULIN GLARGINE 100 UNIT/ML ~~LOC~~ SOLN: 15.0000 [IU] | Freq: Every day | SUBCUTANEOUS | Status: DC

## 2012-09-29 MED ORDER — BISACODYL 10 MG RE SUPP: 10.0000 mg | Freq: Every day | RECTAL | Status: DC | PRN

## 2012-09-29 MED ORDER — TAMSULOSIN HCL 0.4 MG PO CAPS: 0.4000 mg | ORAL_CAPSULE | Freq: Every day | ORAL | Status: DC

## 2012-09-29 MED ORDER — ENOXAPARIN SODIUM 40 MG/0.4ML ~~LOC~~ SOLN: 40.0000 mg | SUBCUTANEOUS | Status: DC

## 2012-09-29 MED ORDER — OXYCODONE HCL 5 MG PO TABS: 5.0000 mg | ORAL_TABLET | ORAL | Status: DC | PRN

## 2012-09-29 MED ORDER — TRAMADOL HCL 50 MG PO TABS: 50.0000 mg | ORAL_TABLET | Freq: Four times a day (QID) | ORAL | Status: DC | PRN

## 2012-09-29 NOTE — Progress Notes (Signed)
Clinical Social Work Department CLINICAL SOCIAL WORK PLACEMENT NOTE 09/29/2012  Patient:  Chelsea Thornton, Chelsea Thornton  Account Number:  [id number] Admit date:  09/23/2012  Clinical Social Worker:  Cori Razor, LCSW  Date/time:  09/26/2012 05:00 PM  Clinical Social Work is seeking post-discharge placement for this patient at the following level of care:   SKILLED NURSING   (*CSW will update this form in Epic as items are completed)   09/26/2012  Patient/family provided with [facility] Department of Clinical Social Work's list of facilities offering this level of care within the geographic area requested by the patient (or if unable, by the patient's family).  09/26/2012  Patient/family informed of their freedom to choose among providers that offer the needed level of care, that participate in Medicare, Medicaid or managed care program needed by the patient, have an available bed and are willing to accept the patient.  09/26/2012  Patient/family informed of [facility]' ownership interest in [facility], as well as of the fact that they are under no obligation to receive care at this facility.  PASARR submitted to EDS on 09/26/2012 PASARR number received from EDS on 09/26/2012  FL2 transmitted to all facilities in geographic area requested by pt/family on  09/26/2012 FL2 transmitted to all facilities within larger geographic area on   Patient informed that his/her managed care company has contracts with or will negotiate with  certain facilities, including the following:     Patient/family informed of bed offers received:  09/26/2012 Patient chooses bed at  Physician recommends and patient chooses bed at    Patient to be transferred to  on   Patient to be transferred to facility by   The following physician request were entered in Epic:   Additional Comments:  Prior authorization received from [employer].  Cori Razor LCSW [phone]

## 2012-09-29 NOTE — Plan of Care (Signed)
Problem: Discharge Progression Outcomes Goal: Complications resolved/controlled Outcome: Adequate for Discharge To snf   Goal: Activity appropriate for discharge plan Outcome: Adequate for Discharge To snf

## 2012-09-29 NOTE — Progress Notes (Signed)
Checked with Dr Izola Price re pt's foley & she stated that pt is to be discharged with catheter. Tomeika Weinmann, [employer]

## 2012-09-29 NOTE — Progress Notes (Signed)
Discharged from floor via stretcher, EMS with pt. No changes in assessment.Chelsea Thornton  

## 2012-09-29 NOTE — Discharge Summary (Addendum)
Physician Discharge Summary  Chelsea Thornton ZOX:[id number] DOB: 09/25/1950 DOA: 09/23/2012  PCP: [name], MD  Admit date: 09/23/2012 Discharge date: 09/29/2012  Recommendations for Outpatient Follow-up:  1. Pt will need to follow up with PCP in 2-3 weeks post discharge 2. Please obtain BMP to evaluate electrolytes and kidney function 3. Please also check CBC to evaluate Hg and Hct levels 4. Please note that pt was diagnosed with diabetes given A1C > 9, Lantus 15 units was prescribed upon discharge  5. Lovenox 40 mg once a day for DVT prophylaxis until weightbearing decrease risk of DVT  6. PlexiPulse foot pumps while in bed with thigh high Ted stocking  7. Total nonweightbearing left lower extremity  8. Knee immobilizer on left lower extremity unless bathing and then no valgus or varus stressing of the knee.  9. Daily dressing changes 10. Pt will be discharged with foley and plan to discontinue in 1-2 days  Discharge Diagnoses: Tibial plateau fracture  Principal Problem:   Knee injury Active Problems:   Alveolar pneumopathy   Acute respiratory failure with hypoxia   DM (diabetes mellitus)   Urinary retention   Anemia   Abnormal CXR   Left tibial fracture   Unspecified constipation   Leukocytosis, unspecified  Discharge Condition: Stable  Diet recommendation: Heart healthy diet discussed in details   Brief narrative:  62 yo female who recently fell and suffered from a tib/fib plateur fracture. Was at home and fell and presented to [facility] and hospitalized for inability to get around due to the fracture. They were going to operate last week but patient refused and wished to be transferred to [facility] to have the surgery. She was dx with pna at [place] and put on azithro and rocephin. There are minimal records sent from [place], including no discharge summary or MAR. Info is very limited. Dr Thomasena Edis who is pt routine ortho surgeon agreed to do surgery here at [facility] long so pt  was transferred for that reason.   Principal Problem:  Knee injury, left tibial plateau fracture  - Per CT scan of 09/16/2012 patient with a fracture extending from the lateral tibial eminence through the medial tibial metaphysis  - s/p ORIF, continue with pain management  - appreciate ortho input  Active Problems:  Alveolar pneumopathy  - close follow up with Dr. 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 CBGs 200 - 300  - Pt will be discharged on Lantus 15 units QHS Urinary retention  - continue Flomax  Anemia of chronic disease  - stable Hg and Hct  Leukocytosis, unspecified  - Likely reactive secondary to recent surgery.  - WBC trending down  - Patient with no upper respiratory symptoms.   Consultants:  Ortho Procedures/Studies:  Chest x-ray 09/24/2012  ORIF left tibial plateau 09/25/2012 per Dr. Thomasena Edis Antibiotics:  Levaquin 02/14 --> 02/15  Code Status: Full  Family Communication: Pt at bedside   Discharge Exam: Filed Vitals:   09/29/12 0710  BP: 121/78  Pulse: 83  Temp: 97.6 F (36.4 C)  Resp: 16   Filed Vitals:   09/28/12 0658 09/28/12 1400 09/28/12 2207 09/29/12 0710  BP: 117/72 103/64 122/69 121/78  Pulse: 80 83 81  83  Temp: 98.6 F (37 C) 98.3 F (36.8 C) 99.3 F (37.4 C) 97.6 F (36.4 C)  TempSrc: Oral Oral Oral Oral  Resp: 16 16 16 16   Height:      Weight:      SpO2: 95% 96% 95% 96%    General: Pt is alert, follows commands appropriately, not in acute distress Cardiovascular: Regular rate and rhythm, S1/S2 +, no murmurs, no rubs, no gallops Respiratory: Clear to auscultation bilaterally, no wheezing, no crackles, no  rhonchi Abdominal: Soft, non tender, non distended, bowel sounds +, no guarding Extremities: no edema, no cyanosis, pulses palpable bilaterally DP and PT Neuro: Grossly nonfocal  Discharge Instructions  Discharge Orders   Future Appointments Provider Department Dept Phone   10/19/2012 3:15 PM Barbaraann Share, MD [facility] [phone]   Future Orders Complete By Expires     Diet - low sodium heart healthy  As directed     Increase activity slowly  As directed         Medication List    TAKE these medications       bisacodyl 10 MG suppository  Commonly known as:  DULCOLAX  Place 1 suppository (10 mg total) rectally daily as needed.     enoxaparin 40 MG/0.4ML injection  Commonly known as:  LOVENOX  Inject 0.4 mLs (40 mg total) into the skin daily.     glimepiride 2 MG tablet  Commonly known as:  AMARYL  Take 1 tablet by mouth daily.     insulin glargine 100 UNIT/ML injection  Commonly known as:  LANTUS  Inject 15 Units into the skin at bedtime.     oxyCODONE 5 MG immediate release tablet  Commonly known as:  Oxy IR/ROXICODONE  Take 1-2 tablets (5-10 mg total) by mouth every 4 (four) hours as needed.     Tamsulosin HCl 0.4 MG Caps  Commonly known as:  FLOMAX  Take 1 capsule (0.4 mg total) by mouth daily after supper.     traMADol 50 MG tablet  Commonly known as:  ULTRAM  Take 1 tablet (50 mg total) by mouth every 6 (six) hours as needed for pain.           Follow-up Information   Follow up with [name], MD In 2 weeks.   Contact information:   [address] [place] [address] [phone]        The results of significant diagnostics from this hospitalization (including imaging, microbiology, ancillary and laboratory) are listed below for reference.     Microbiology: Recent Results (from the past 240 hour(s))  SURGICAL PCR SCREEN     Status: None   Collection Time    09/25/12  9:59 AM      Result Value Range Status   MRSA, PCR  NEGATIVE  NEGATIVE Final   Staphylococcus aureus NEGATIVE  NEGATIVE Final   Comment:            The Xpert SA Assay (FDA     approved for NASAL specimens     in patients over 1 years of age),     is one component of     a comprehensive surveillance     program.  Test performance has     been validated by [employer] for patients greater     than or equal to 33 year old.     It is not intended     to diagnose infection nor to  guide or monitor treatment.  URINE CULTURE     Status: None   Collection Time    09/28/12  6:50 AM      Result Value Range Status   Specimen Description URINE, CATHETERIZED   Final   Special Requests NONE   Final   Culture  Setup Time 09/28/2012 08:27   Final   Colony Count NO GROWTH   Final   Culture NO GROWTH   Final   Report Status 09/29/2012 FINAL   Final     Labs: Basic Metabolic Panel:  Recent Labs Lab 09/25/12 0428 09/26/12 0425 09/27/12 0510 09/28/12 0510 09/29/12 0537  NA 135 132* 135 134* 135  K 3.9 4.2 3.5 4.1 4.0  CL 98 95* 96 98 98  CO2 29 28 29 28 27   GLUCOSE 156* 166* 177* 197* 169*  BUN 7 7 12 8 10   CREATININE 0.35* 0.34* 0.40* 0.32* 0.36*  CALCIUM 8.6 8.7 8.3* 8.3* 8.4   CBC:  Recent Labs Lab 09/25/12 0428 09/26/12 0425 09/27/12 0510 09/28/12 0510 09/29/12 0537  WBC 8.8 11.4* 10.6* 12.8* 11.0*  HGB 10.5* 10.8* 10.1* 10.1* 10.1*  HCT 32.4* 32.3* 30.3* 30.8* 30.3*  MCV 84.2 83.0 84.2 83.5 84.2  PLT 528* 584* 504* 571* 594*   CBG:  Recent Labs Lab 09/28/12 0730 09/28/12 1147 09/28/12 1725 09/28/12 2203 09/29/12 0800  GLUCAP 203* 219* 204* 236* 155*     SIGNED: Time coordinating discharge: Over 30 minutes  Debbora Presto, MD  [facility] 09/29/2012, 9:57 AM Pager [phone]  If 7PM-7AM, please contact night-coverage [email or web address] Password [email or web address]

## 2012-10-19 ENCOUNTER — Ambulatory Visit: Payer: Commercial Managed Care - PPO | Admitting: Pulmonary Disease

## 2012-10-24 ENCOUNTER — Ambulatory Visit: Payer: Commercial Managed Care - PPO | Admitting: Pulmonary Disease

## 2012-12-12 ENCOUNTER — Ambulatory Visit: Payer: Commercial Managed Care - PPO | Admitting: Pulmonary Disease

## 2013-01-03 ENCOUNTER — Encounter: Payer: Self-pay | Admitting: Pulmonary Disease

## 2013-01-03 ENCOUNTER — Ambulatory Visit (INDEPENDENT_AMBULATORY_CARE_PROVIDER_SITE_OTHER): Payer: Commercial Managed Care - PPO | Admitting: Pulmonary Disease

## 2013-01-03 ENCOUNTER — Ambulatory Visit (INDEPENDENT_AMBULATORY_CARE_PROVIDER_SITE_OTHER)
Admission: RE | Admit: 2013-01-03 | Discharge: 2013-01-03 | Disposition: A | Discharge status: Home / Self Care | Payer: Commercial Managed Care - PPO | Source: Ambulatory Visit | Attending: Pulmonary Disease | Admitting: Pulmonary Disease

## 2013-01-03 VITALS — BP 110/70 | HR 85 | Temp 97.7°F | Ht 66.0 in | Wt 202.0 lb

## 2013-01-03 DIAGNOSIS — J189 Pneumonia, unspecified organism: Secondary | ICD-10-CM

## 2013-01-03 DIAGNOSIS — J8409 Other alveolar and parieto-alveolar conditions: Secondary | ICD-10-CM

## 2013-01-03 NOTE — Progress Notes (Signed)
  Subjective:    Patient ID: Chelsea Thornton, female    DOB: 17-Aug-1950, 62 y.o.   MRN: [id number]  HPI Patient comes in today for followup of her  pulmonary infiltrates of unknown origin.  She was treated with a course of prednisone with near total resolution, and has had no further pulmonary symptoms since that time.  She denies any significant cough, congestion, or increased shortness of breath.  She did have an episode of bronchitis recently which responded to antibiotics from her primary care physician.  She has had a chest x-ray today which shows near total resolution of her infiltrates, with residual scarring remaining.   Review of Systems  Constitutional: Negative for fever and unexpected weight change.  HENT: Negative for ear pain, nosebleeds, congestion, sore throat, rhinorrhea, sneezing, trouble swallowing, dental problem, postnasal drip and sinus pressure.   Eyes: Negative for redness and itching.  Respiratory: Negative for cough, chest tightness, shortness of breath and wheezing.   Cardiovascular: Negative for palpitations and leg swelling.  Gastrointestinal: Negative for nausea and vomiting.  Genitourinary: Negative for dysuria.  Musculoskeletal: Negative for joint swelling.  Skin: Negative for rash.  Neurological: Negative for headaches.  Hematological: Does not bruise/bleed easily.  Psychiatric/Behavioral: Negative for dysphoric mood. The patient is not nervous/anxious.        Objective:   Physical Exam Well-developed female in no acute distress Nose without purulence or discharge noted Neck without lymphadenopathy or thyromegaly Chest with some faint crackles in the left midlung zone posteriorly, otherwise clear Cardiac exam with regular rate and rhythm Lower extremities with mild ankle edema on the left, clear on the right.  No cyanosis noted Alert and oriented, moves all 4 extremities.       Assessment & Plan:

## 2013-01-03 NOTE — Assessment & Plan Note (Signed)
The patient is doing very well from a pulmonary standpoint, with no significant shortness of breath or cough.  Her chest x-ray shows near resolution of her infiltrates, with some mild residual scarring.  She understands that if her infiltrates recur, she will need a thoracoscopic lung biopsy for definitive diagnosis.  I would like to see her back in one year, but she is to call if she has any worsening pulmonary symptoms.

## 2013-01-03 NOTE — Patient Instructions (Addendum)
Your chest xray looks much better.   Would like to see you back in one year, but call if increasing pulmonary symptoms.

## 2013-06-05 NOTE — Progress Notes (Signed)
Need orders in EPIC.  Surgery scheduled for 06/19/13.  Preop 06/16/13 at 0900am, Thank You.

## 2013-06-15 NOTE — Patient Instructions (Addendum)
2020 Chelsea Thornton  06/16/2013   Your procedure is scheduled on: 11-10 -2014  Report to [facility] at    1:45     PM.  Call this number if you have problems the morning of surgery: [phone]  Or Presurgical Testing [phone]([name])   Remember: Follow any bowel prep instructions per MD office. For Cpap use: Bring mask and tubing only.   Do not eat food:After Midnight.  May have clear liquids:up to 6 Hours before arrival. Nothing after : 0900 AM  Clear liquids include soda, tea, black coffee, apple or grape juice, broth.  Take these medicines the morning of surgery with A SIP OF WATER: none. Do not take any Diabetic meds AM of.   Do not wear jewelry, make-up or nail polish.  Do not wear lotions, powders, or perfumes. You may wear deodorant.  Do not shave 12 hours prior to first CHG shower(legs and under arms).(face and neck okay.)  Do not bring valuables to the hospital.  Contacts, dentures or removable bridgework, body piercing, hair pins may not be worn into surgery.  Leave suitcase in the car. After surgery it may be brought to your room.  For patients admitted to the hospital, checkout time is 11:00 AM the day of discharge.   Patients discharged the day of surgery will not be allowed to drive home. Must have responsible person with you x 24 hours once discharged.  Name and phone number of your driver: sister [name] [phone] cell  Special Instructions: CHG(Chlorhedine 4%-"Hibiclens","Betasept","Aplicare") Shower Use Special Wash: see special instructions.(avoid face and genitals)   Please read over the following fact sheets that you were given:  Blood Transfusion fact sheet, Incentive Spirometry Instruction.  Remember : Type/Screen "Blue armbands" - may not be removed once applied(would result in being retested if removed).  Failure to follow these instructions may result in Cancellation of your surgery.   Patient  signature_______________________________________________________

## 2013-06-15 NOTE — Progress Notes (Signed)
01-05-13 lov note and ekg dr Dulce Sellar cardiology on chart 07-07-11 heart cath report [facility] on chart Chest xray 01-03-13 epic

## 2013-06-16 ENCOUNTER — Encounter (HOSPITAL_COMMUNITY): Payer: Self-pay | Admitting: Pharmacy Technician

## 2013-06-16 ENCOUNTER — Encounter (HOSPITAL_COMMUNITY): Payer: Self-pay

## 2013-06-16 ENCOUNTER — Encounter (HOSPITAL_COMMUNITY)
Admission: RE | Admit: 2013-06-16 | Discharge: 2013-06-16 | Disposition: A | Discharge status: Home / Self Care | Payer: Commercial Managed Care - PPO | Source: Ambulatory Visit | Attending: Orthopedic Surgery | Admitting: Orthopedic Surgery

## 2013-06-16 HISTORY — PX: TOE SURGERY: SHX1073

## 2013-06-16 LAB — PROTIME-INR
INR: 0.97 (ref 0.00–1.49)
Prothrombin Time: 12.7 seconds (ref 11.6–15.2)

## 2013-06-16 LAB — BASIC METABOLIC PANEL
BUN: 18 mg/dL (ref 6–23)
Calcium: 9.7 mg/dL (ref 8.4–10.5)
Chloride: 96 mEq/L (ref 96–112)
GFR calc Af Amer: >90 mL/min (ref >90)
GFR calc non Af Amer: >90 mL/min (ref >90)
Potassium: 3.9 mEq/L (ref 3.5–5.1)
Sodium: 135 mEq/L (ref 135–145)

## 2013-06-16 LAB — TYPE AND SCREEN
ABO/RH(D): O POS
Antibody Screen: NEGATIVE

## 2013-06-16 LAB — CBC
HCT: 37.9 % (ref 36.0–46.0)
Hemoglobin: 12.7 g/dL (ref 12.0–15.0)
MCHC: 33.5 g/dL (ref 30.0–36.0)
Platelets: 367 10*3/uL (ref 150–400)
RBC: 4.51 MIL/uL (ref 3.87–5.11)
WBC: 10.2 10*3/uL (ref 4.0–10.5)

## 2013-06-16 LAB — URINALYSIS, ROUTINE W REFLEX MICROSCOPIC
Ketones, ur: NEGATIVE mg/dL
Nitrite: NEGATIVE
Protein, ur: NEGATIVE mg/dL
Urobilinogen, UA: 0.2 mg/dL (ref 0.0–1.0)

## 2013-06-16 LAB — URINE MICROSCOPIC-ADD ON

## 2013-06-16 NOTE — Pre-Procedure Instructions (Addendum)
06-16-13 EKG-5'14-report with chart/ CXR 5'14 -Epic. Requested Stress/EKG done 06-16-13 [facility], pending reports. 06-16-13 1620 labs viewable in Epic-note sent Dr. Nilsa Nutting office.

## 2013-06-16 NOTE — Progress Notes (Signed)
06-16-13 1625 labs viewable in Epic-please note urinalysis.

## 2013-06-18 LAB — URINE CULTURE

## 2013-06-18 NOTE — H&P (Signed)
Chelsea Thornton is an 62 y.o. female.    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  Procedure:   Removal of painful retained hardware of the left knee.  HPI: Pt is a 62 y.o. female with a history of left medial tibial plateau fracture fixed in February of 2014 by Dr. Valma Cava requiring medial and lateral proximal tibial plates. She has had persistent pain and dysfunction in this left knee since that time. She has been treated by Dr. Thomasena Edis to the point of union of the proximal tibia as much as could be possible but there is significant malunion and posttraumatic arthritis with prominent and painful hardware. She has a current knee brace to try to provide some stability to the knee. She is requiring to use a walker. She is very much at this point interested in trying to retain her quality of life back. She has been a patient of Dr. Thomasena Edis for some time and has always appreciated all that he has ever done for her.  She uses a walker to get around. She has a big knee brace on her leg to provide some support. She has a varus thrust with ambulation. She has a single lateral based incision over her knee and across the knee up into the distal thigh and extending laterally over the leg. She is tender on the medial side of the knee as well as laterally. There are no signs of infection or cellulitis currently. She does have adipose tissue in the lower extremity. The plan is as follows, she wishes very much to try to improve her overall quality of life and functional mobilities and thus wold like to proceed with surgical intervention. This will need to be done in a staged procedure with the first procedure to remove hardware and after the wound has healed to proceed with a total knee arthroplasty.   Risks, benefits and expectations were discussed with the patient.  Risks  including but not limited to the risk of anesthesia, blood clots, nerve damage, blood vessel damage, failure of the prosthesis, infection and up to and including death.  Patient understand the risks, benefits and expectations and wishes to proceed with surgery.    PCP:  [name], MD  D/C Plans:  Home with HHPT/SNF  Post-op Meds:     No Rx given   Tranexamic Acid:   Not to be given - CAD  Decadron:   Not to be given - DM   PMH: Past Medical History  Diagnosis Date  . Diabetes mellitus   . Hyperlipidemia   . Pneumonia   . Abnormal CXR 09/24/2012    Dr. Oneita Hurt like little clouds"-was told is improved.Denies SOB or trouble breathing  . Heart attack     '12-"dizzy spells"    PSH: Past Surgical History  Procedure Laterality Date  . Total knee arthroplasty  2007    Right  . Video bronchoscopy  11/03/2011    Procedure: VIDEO BRONCHOSCOPY WITHOUT FLUORO;  Surgeon: Barbaraann Share, MD;  Location: [facility];  Service: Cardiopulmonary;  Laterality: Bilateral;  . Orif tibia plateau Left 09/25/2012    Procedure: OPEN REDUCTION INTERNAL FIXATION (ORIF) TIBIAL PLATEAU;  Surgeon: Eugenia Mcalpine, MD;  Location: [facility];  Service: Orthopedics;  Laterality: Left;  . Cardiac catheterization      '12- stent placed-[facility]  . Toe surgery Left 06-16-13    Big toe and  2nd toe- tip removed -2 yrs ago, to clear infection    Social History:  reports that she has never smoked. She does not have any smokeless tobacco history on file. She reports that she does not drink alcohol or use illicit drugs.  Allergies:  Allergies  Allergen Reactions  . Nutritional Supplements     06-16-13 Pt. Unsure of why this is on record.  . Prednisone Other (See Comments)    Patient states that she can take this medication now  . Shrimp [Shellfish Allergy]     Medications: No current facility-administered medications for this encounter.   Current Outpatient Prescriptions  Medication Sig Dispense  Refill  . alendronate (FOSAMAX) 70 MG tablet Take 70 mg by mouth once a week. Take with a full glass of water on an empty stomach.      Marland Kitchen aspirin EC 81 MG tablet Take 81 mg by mouth daily.      . Calcium Carb-Cholecalciferol (CALCIUM 600 + D PO) Take 1 tablet by mouth 3 (three) times daily.      Marland Kitchen glimepiride (AMARYL) 2 MG tablet Take 1 tablet by mouth daily with breakfast.       . metFORMIN (GLUCOPHAGE-XR) 500 MG 24 hr tablet Take 1 tablet by mouth daily.        Review of Systems  Constitutional: Negative.   HENT: Negative.   Eyes: Negative.   Respiratory: Negative.   Cardiovascular: Negative.   Gastrointestinal: Negative.   Genitourinary: Negative.   Musculoskeletal: Positive for joint pain.  Skin: Negative.   Neurological: Negative.   Endo/Heme/Allergies: Negative.   Psychiatric/Behavioral: Negative.       Physical Exam  Constitutional: She is oriented to person, place, and time. She appears well-developed and well-nourished.  HENT:  Head: Normocephalic and atraumatic.  Mouth/Throat: Oropharynx is clear and moist.  Eyes: Pupils are equal, round, and reactive to light.  Neck: Neck supple. No JVD present. No tracheal deviation present. No thyromegaly present.  Cardiovascular: Normal rate and intact distal pulses.   Respiratory: Effort normal and breath sounds normal. No respiratory distress. She has no wheezes.  GI: Soft. There is no tenderness. There is no guarding.  Musculoskeletal:       Left knee: She exhibits decreased range of motion, swelling and bony tenderness. She exhibits no effusion, no ecchymosis, no laceration and no erythema. Tenderness found. Medial joint line tenderness noted.  Lymphadenopathy:    She has no cervical adenopathy.  Neurological: She is alert and oriented to person, place, and time.  Skin: Skin is warm and dry.  Psychiatric: She has a normal mood and affect.     Assessment/Plan Assessment:   Posttraumatic arthritis involving the left  proximal medial tibia with malunion of a comminuted medial tibial plateau fracture with retained painful hardware.   Plan: Patient will undergo a removal of painful retained hardware of the left knee on 06/19/2013 per Dr. Charlann Boxer at [facility]. Risks benefits and expectations were discussed with the patient. Patient understand risks, benefits and expectations and wishes to proceed.     Anastasio Auerbach Emmajo Bennette   PAC  06/18/2013, 5:11 PM

## 2013-06-19 ENCOUNTER — Inpatient Hospital Stay (HOSPITAL_COMMUNITY)
Admission: RE | Admit: 2013-06-19 | Discharge: 2013-06-20 | DRG: 496 | Disposition: A | Discharge status: Home Health | Payer: Commercial Managed Care - PPO | Source: Ambulatory Visit | Attending: Orthopedic Surgery | Admitting: Orthopedic Surgery

## 2013-06-19 ENCOUNTER — Inpatient Hospital Stay (HOSPITAL_COMMUNITY): Payer: Commercial Managed Care - PPO | Admitting: Anesthesiology

## 2013-06-19 ENCOUNTER — Encounter (HOSPITAL_COMMUNITY)
Admission: RE | Disposition: A | Discharge status: Home Health | Payer: Self-pay | Source: Ambulatory Visit | Attending: Orthopedic Surgery

## 2013-06-19 ENCOUNTER — Encounter (HOSPITAL_COMMUNITY): Payer: Commercial Managed Care - PPO | Admitting: Anesthesiology

## 2013-06-19 ENCOUNTER — Encounter (HOSPITAL_COMMUNITY): Payer: Self-pay | Admitting: *Deleted

## 2013-06-19 DIAGNOSIS — D500 Iron deficiency anemia secondary to blood loss (chronic): Secondary | ICD-10-CM | POA: Diagnosis not present

## 2013-06-19 DIAGNOSIS — Z96659 Presence of unspecified artificial knee joint: Secondary | ICD-10-CM

## 2013-06-19 DIAGNOSIS — Z9889 Other specified postprocedural states: Secondary | ICD-10-CM

## 2013-06-19 DIAGNOSIS — Z79899 Other long term (current) drug therapy: Secondary | ICD-10-CM

## 2013-06-19 DIAGNOSIS — E785 Hyperlipidemia, unspecified: Secondary | ICD-10-CM | POA: Diagnosis present

## 2013-06-19 DIAGNOSIS — M12569 Traumatic arthropathy, unspecified knee: Principal | ICD-10-CM | POA: Diagnosis present

## 2013-06-19 DIAGNOSIS — Z472 Encounter for removal of internal fixation device: Secondary | ICD-10-CM

## 2013-06-19 DIAGNOSIS — E669 Obesity, unspecified: Secondary | ICD-10-CM | POA: Diagnosis present

## 2013-06-19 DIAGNOSIS — D62 Acute posthemorrhagic anemia: Secondary | ICD-10-CM | POA: Diagnosis not present

## 2013-06-19 DIAGNOSIS — IMO0002 Reserved for concepts with insufficient information to code with codable children: Secondary | ICD-10-CM | POA: Diagnosis present

## 2013-06-19 DIAGNOSIS — Z8701 Personal history of pneumonia (recurrent): Secondary | ICD-10-CM

## 2013-06-19 DIAGNOSIS — Z6831 Body mass index (BMI) 31.0-31.9, adult: Secondary | ICD-10-CM

## 2013-06-19 DIAGNOSIS — E119 Type 2 diabetes mellitus without complications: Secondary | ICD-10-CM | POA: Diagnosis present

## 2013-06-19 HISTORY — PX: HARDWARE REMOVAL: SHX979

## 2013-06-19 LAB — GLUCOSE, CAPILLARY
Glucose-Capillary: 119 mg/dL — ABNORMAL HIGH (ref 70–99)
Glucose-Capillary: 98 mg/dL (ref 70–99)
Glucose-Capillary: 130 mg/dL — ABNORMAL HIGH (ref 70–99)

## 2013-06-19 SURGERY — REMOVAL, HARDWARE
Anesthesia: Spinal | Site: Knee | Laterality: Left | Wound class: Clean

## 2013-06-19 MED ORDER — MENTHOL 3 MG MT LOZG
1.0000 | LOZENGE | OROMUCOSAL | Status: DC | PRN
  Filled 2013-06-19: qty 9

## 2013-06-19 MED ORDER — ONDANSETRON HCL 4 MG/2ML IJ SOLN
4.0000 mg | Freq: Four times a day (QID) | INTRAMUSCULAR | Status: DC | PRN
  Administered 2013-06-20: 4 mg via INTRAVENOUS
  Filled 2013-06-19: qty 2

## 2013-06-19 MED ORDER — METOCLOPRAMIDE HCL 10 MG PO TABS: 5.0000 mg | ORAL_TABLET | Freq: Three times a day (TID) | ORAL | Status: DC | PRN

## 2013-06-19 MED ORDER — ZOLPIDEM TARTRATE 5 MG PO TABS: 5.0000 mg | ORAL_TABLET | Freq: Every evening | ORAL | Status: DC | PRN

## 2013-06-19 MED ORDER — CELECOXIB 200 MG PO CAPS
200.0000 mg | ORAL_CAPSULE | Freq: Two times a day (BID) | ORAL | Status: DC
  Administered 2013-06-19 – 2013-06-20 (×2): 200 mg via ORAL
  Filled 2013-06-19 (×3): qty 1

## 2013-06-19 MED ORDER — METHOCARBAMOL 100 MG/ML IJ SOLN
500.0000 mg | Freq: Four times a day (QID) | INTRAVENOUS | Status: DC | PRN
  Administered 2013-06-20: 11:00:00 500 mg via INTRAVENOUS
  Filled 2013-06-19: qty 5

## 2013-06-19 MED ORDER — METFORMIN HCL ER 500 MG PO TB24
500.0000 mg | ORAL_TABLET | Freq: Every day | ORAL | Status: DC
  Filled 2013-06-19: qty 1

## 2013-06-19 MED ORDER — LACTATED RINGERS IV SOLN: INTRAVENOUS | Status: DC

## 2013-06-19 MED ORDER — CEFAZOLIN SODIUM-DEXTROSE 2-3 GM-% IV SOLR
2.0000 g | Freq: Four times a day (QID) | INTRAVENOUS | Status: AC
  Administered 2013-06-19 – 2013-06-20 (×2): 2 g via INTRAVENOUS
  Filled 2013-06-19 (×2): qty 50

## 2013-06-19 MED ORDER — PHENYLEPHRINE HCL 10 MG/ML IJ SOLN
INTRAMUSCULAR | Status: DC | PRN
  Administered 2013-06-19 (×3): 40 ug via INTRAVENOUS

## 2013-06-19 MED ORDER — HYDROMORPHONE HCL PF 1 MG/ML IJ SOLN
INTRAMUSCULAR | Status: AC
  Filled 2013-06-19: qty 1

## 2013-06-19 MED ORDER — CHLORHEXIDINE GLUCONATE 4 % EX LIQD: 60.0000 mL | Freq: Once | CUTANEOUS | Status: DC

## 2013-06-19 MED ORDER — GLIMEPIRIDE 2 MG PO TABS
2.0000 mg | ORAL_TABLET | Freq: Every day | ORAL | Status: DC
  Administered 2013-06-20: 2 mg via ORAL
  Filled 2013-06-19 (×2): qty 1

## 2013-06-19 MED ORDER — FERROUS SULFATE 325 (65 FE) MG PO TABS
325.0000 mg | ORAL_TABLET | Freq: Three times a day (TID) | ORAL | Status: DC
  Administered 2013-06-20 (×2): 325 mg via ORAL
  Filled 2013-06-19 (×4): qty 1

## 2013-06-19 MED ORDER — ALUM & MAG HYDROXIDE-SIMETH 200-200-20 MG/5ML PO SUSP: 30.0000 mL | ORAL | Status: DC | PRN

## 2013-06-19 MED ORDER — MIDAZOLAM HCL 5 MG/5ML IJ SOLN
INTRAMUSCULAR | Status: DC | PRN
  Administered 2013-06-19: 2 mg via INTRAVENOUS

## 2013-06-19 MED ORDER — FENTANYL CITRATE 0.05 MG/ML IJ SOLN
INTRAMUSCULAR | Status: DC | PRN
  Administered 2013-06-19: 25 ug via INTRAVENOUS

## 2013-06-19 MED ORDER — HYDROMORPHONE HCL PF 1 MG/ML IJ SOLN
0.2500 mg | INTRAMUSCULAR | Status: DC | PRN
  Administered 2013-06-19: 0.5 mg via INTRAVENOUS

## 2013-06-19 MED ORDER — PHENOL 1.4 % MT LIQD
1.0000 | OROMUCOSAL | Status: DC | PRN
  Filled 2013-06-19: qty 177

## 2013-06-19 MED ORDER — POLYETHYLENE GLYCOL 3350 17 G PO PACK
17.0000 g | PACK | Freq: Two times a day (BID) | ORAL | Status: DC
  Administered 2013-06-19 – 2013-06-20 (×2): 17 g via ORAL

## 2013-06-19 MED ORDER — FLEET ENEMA 7-19 GM/118ML RE ENEM: 1.0000 | ENEMA | Freq: Once | RECTAL | Status: AC | PRN

## 2013-06-19 MED ORDER — ASPIRIN EC 325 MG PO TBEC
325.0000 mg | DELAYED_RELEASE_TABLET | Freq: Two times a day (BID) | ORAL | Status: DC
  Administered 2013-06-20: 07:00:00 325 mg via ORAL
  Filled 2013-06-19 (×3): qty 1

## 2013-06-19 MED ORDER — PROPOFOL 10 MG/ML IV BOLUS
INTRAVENOUS | Status: DC | PRN
  Administered 2013-06-19 (×2): 20 mg via INTRAVENOUS

## 2013-06-19 MED ORDER — BUPIVACAINE IN DEXTROSE 0.75-8.25 % IT SOLN
INTRATHECAL | Status: DC | PRN
  Administered 2013-06-19: 2 mL via INTRATHECAL

## 2013-06-19 MED ORDER — LACTATED RINGERS IV SOLN
INTRAVENOUS | Status: AC
  Administered 2013-06-19: 17:00:00 via INTRAVENOUS
  Administered 2013-06-19: 1000 mL via INTRAVENOUS

## 2013-06-19 MED ORDER — METOCLOPRAMIDE HCL 5 MG/ML IJ SOLN
5.0000 mg | Freq: Three times a day (TID) | INTRAMUSCULAR | Status: DC | PRN
  Administered 2013-06-20: 10 mg via INTRAVENOUS
  Filled 2013-06-19: qty 2

## 2013-06-19 MED ORDER — METHOCARBAMOL 500 MG PO TABS
500.0000 mg | ORAL_TABLET | Freq: Four times a day (QID) | ORAL | Status: DC | PRN
  Administered 2013-06-20: 500 mg via ORAL
  Filled 2013-06-19: qty 1

## 2013-06-19 MED ORDER — PROPOFOL INFUSION 10 MG/ML OPTIME
INTRAVENOUS | Status: DC | PRN
  Administered 2013-06-19: 50 ug/kg/min via INTRAVENOUS

## 2013-06-19 MED ORDER — DIPHENHYDRAMINE HCL 25 MG PO CAPS: 25.0000 mg | ORAL_CAPSULE | Freq: Four times a day (QID) | ORAL | Status: DC | PRN

## 2013-06-19 MED ORDER — BISACODYL 10 MG RE SUPP: 10.0000 mg | Freq: Every day | RECTAL | Status: DC | PRN

## 2013-06-19 MED ORDER — CEFAZOLIN SODIUM-DEXTROSE 2-3 GM-% IV SOLR
2.0000 g | INTRAVENOUS | Status: AC
  Administered 2013-06-19: 2 g via INTRAVENOUS

## 2013-06-19 MED ORDER — BUPIVACAINE HCL (PF) 0.75 % IJ SOLN
INTRAMUSCULAR | Status: DC | PRN
  Administered 2013-06-19: 15 mg via INTRATHECAL

## 2013-06-19 MED ORDER — CEFAZOLIN SODIUM-DEXTROSE 2-3 GM-% IV SOLR
INTRAVENOUS | Status: AC
  Filled 2013-06-19: qty 50

## 2013-06-19 MED ORDER — HYDROMORPHONE HCL PF 1 MG/ML IJ SOLN: 0.5000 mg | INTRAMUSCULAR | Status: DC | PRN

## 2013-06-19 MED ORDER — SODIUM CHLORIDE 0.9 % IV SOLN
INTRAVENOUS | Status: DC
  Administered 2013-06-19 – 2013-06-20 (×2): via INTRAVENOUS
  Filled 2013-06-19 (×4): qty 1000

## 2013-06-19 MED ORDER — HYDROCODONE-ACETAMINOPHEN 7.5-325 MG PO TABS
1.0000 | ORAL_TABLET | ORAL | Status: DC
  Administered 2013-06-19: 20:00:00 1 via ORAL
  Administered 2013-06-20 (×5): 2 via ORAL
  Filled 2013-06-19: qty 2
  Filled 2013-06-19: qty 1
  Filled 2013-06-19 (×4): qty 2

## 2013-06-19 MED ORDER — INSULIN ASPART 100 UNIT/ML ~~LOC~~ SOLN
0.0000 [IU] | Freq: Three times a day (TID) | SUBCUTANEOUS | Status: DC
  Administered 2013-06-20: 3 [IU] via SUBCUTANEOUS

## 2013-06-19 MED ORDER — ONDANSETRON HCL 4 MG PO TABS: 4.0000 mg | ORAL_TABLET | Freq: Four times a day (QID) | ORAL | Status: DC | PRN

## 2013-06-19 MED ORDER — DOCUSATE SODIUM 100 MG PO CAPS
100.0000 mg | ORAL_CAPSULE | Freq: Two times a day (BID) | ORAL | Status: DC
  Administered 2013-06-19 – 2013-06-20 (×2): 100 mg via ORAL

## 2013-06-19 SURGICAL SUPPLY — 58 items
ADH SKN CLS APL DERMABOND .7 (GAUZE/BANDAGES/DRESSINGS) ×1
BAG SPEC THK2 15X12 ZIP CLS (MISCELLANEOUS) ×1
BAG ZIPLOCK 12X15 (MISCELLANEOUS) ×2 IMPLANT
BANDAGE ELASTIC 6 VELCRO ST LF (GAUZE/BANDAGES/DRESSINGS) ×2 IMPLANT
BANDAGE ESMARK 6X9 LF (GAUZE/BANDAGES/DRESSINGS) ×1 IMPLANT
BLADE HEX COATED 2.75 (ELECTRODE) ×1 IMPLANT
BNDG CMPR 9X6 STRL LF SNTH (GAUZE/BANDAGES/DRESSINGS)
BNDG ESMARK 6X9 LF (GAUZE/BANDAGES/DRESSINGS)
CLOTH BEACON ORANGE TIMEOUT ST (SAFETY) ×2 IMPLANT
COVER MAYO STAND STRL (DRAPES) IMPLANT
CUFF TOURN SGL QUICK 34 (TOURNIQUET CUFF) ×2
CUFF TRNQT CYL 34X4X40X1 (TOURNIQUET CUFF) ×1 IMPLANT
DERMABOND ADVANCED (GAUZE/BANDAGES/DRESSINGS) ×1
DERMABOND ADVANCED .7 DNX12 (GAUZE/BANDAGES/DRESSINGS) IMPLANT
DRAPE C-ARM 42X120 X-RAY (DRAPES) IMPLANT
DRAPE EXTREMITY T 121X128X90 (DRAPE) IMPLANT
DRAPE OEC MINIVIEW 54X84 (DRAPES) IMPLANT
DRAPE POUCH INSTRU U-SHP 10X18 (DRAPES) ×2 IMPLANT
DRAPE STERI IOBAN 125X83 (DRAPES) ×1 IMPLANT
DRAPE U-SHAPE 47X51 STRL (DRAPES) ×2 IMPLANT
DRSG AQUACEL AG ADV 3.5X14 (GAUZE/BANDAGES/DRESSINGS) ×1 IMPLANT
DRSG EMULSION OIL 3X16 NADH (GAUZE/BANDAGES/DRESSINGS) ×1 IMPLANT
DRSG PAD ABDOMINAL 8X10 ST (GAUZE/BANDAGES/DRESSINGS) ×1 IMPLANT
DURAPREP 26ML APPLICATOR (WOUND CARE) ×2 IMPLANT
ELECT REM PT RETURN 9FT ADLT (ELECTROSURGICAL) ×2
ELECTRODE REM PT RTRN 9FT ADLT (ELECTROSURGICAL) ×1 IMPLANT
GLOVE BIOGEL PI IND STRL 7.5 (GLOVE) ×1 IMPLANT
GLOVE BIOGEL PI IND STRL 8 (GLOVE) ×1 IMPLANT
GLOVE BIOGEL PI INDICATOR 7.5 (GLOVE) ×1
GLOVE BIOGEL PI INDICATOR 8 (GLOVE) ×2
GLOVE ECLIPSE 7.5 STRL STRAW (GLOVE) ×1 IMPLANT
GLOVE ECLIPSE 8.0 STRL XLNG CF (GLOVE) IMPLANT
GLOVE ORTHO TXT STRL SZ7.5 (GLOVE) ×4 IMPLANT
GLOVE SURG ORTHO 8.0 STRL STRW (GLOVE) ×2 IMPLANT
GOWN BRE IMP PREV XXLGXLNG (GOWN DISPOSABLE) ×4 IMPLANT
GOWN PREVENTION PLUS LG XLONG (DISPOSABLE) ×2 IMPLANT
HANDPIECE INTERPULSE COAX TIP (DISPOSABLE) ×2
IV NS 1000ML (IV SOLUTION) ×2
IV NS 1000ML BAXH (IV SOLUTION) IMPLANT
KIT BASIN OR (CUSTOM PROCEDURE TRAY) ×2 IMPLANT
MANIFOLD NEPTUNE II (INSTRUMENTS) ×2 IMPLANT
NS IRRIG 1000ML POUR BTL (IV SOLUTION) ×2 IMPLANT
PACK TOTAL JOINT (CUSTOM PROCEDURE TRAY) ×2 IMPLANT
PADDING CAST COTTON 6X4 STRL (CAST SUPPLIES) ×1 IMPLANT
POSITIONER SURGICAL ARM (MISCELLANEOUS) ×1 IMPLANT
SET HNDPC FAN SPRY TIP SCT (DISPOSABLE) IMPLANT
SPONGE GAUZE 4X4 12PLY (GAUZE/BANDAGES/DRESSINGS) ×2 IMPLANT
STAPLER VISISTAT 35W (STAPLE) IMPLANT
STRIP CLOSURE SKIN 1/2X4 (GAUZE/BANDAGES/DRESSINGS) ×2 IMPLANT
SUT MNCRL AB 4-0 PS2 18 (SUTURE) ×1 IMPLANT
SUT VIC AB 1 CT1 36 (SUTURE) ×4 IMPLANT
SUT VIC AB 2-0 CT1 27 (SUTURE) ×4
SUT VIC AB 2-0 CT1 TAPERPNT 27 (SUTURE) ×1 IMPLANT
SUT VLOC 180 0 24IN GS25 (SUTURE) ×1 IMPLANT
SWAB COLLECTION DEVICE MRSA (MISCELLANEOUS) ×1 IMPLANT
TOWEL OR 17X26 10 PK STRL BLUE (TOWEL DISPOSABLE) ×4 IMPLANT
TUBE ANAEROBIC SPECIMEN COL (MISCELLANEOUS) ×1 IMPLANT
WATER STERILE IRR 1500ML POUR (IV SOLUTION) ×1 IMPLANT

## 2013-06-19 NOTE — Anesthesia Preprocedure Evaluation (Addendum)
Anesthesia Evaluation  Patient identified by MRN, date of birth, ID band Patient awake    Reviewed: Allergy & Precautions, H&P , NPO status , Patient's Chart, lab work & pertinent test results  Airway Mallampati: II TM Distance: >3 FB Neck ROM: full    Dental no notable dental hx. (+) Teeth Intact and Dental Advisory Given   Pulmonary neg pulmonary ROS, pneumonia -, resolved,  Pneumonia 2/14 breath sounds clear to auscultation  Pulmonary exam normal       Cardiovascular Exercise Tolerance: Good + Past MI and + Cardiac Stents negative cardio ROS  Rhythm:regular Rate:Normal  MI and stent 2012   Neuro/Psych negative neurological ROS  negative psych ROS   GI/Hepatic negative GI ROS, Neg liver ROS,   Endo/Other  diabetes, Well Controlled, Type 2, Oral Hypoglycemic Agents  Renal/GU negative Renal ROS  negative genitourinary   Musculoskeletal   Abdominal   Peds  Hematology negative hematology ROS (+)   Anesthesia Other Findings   Reproductive/Obstetrics negative OB ROS                         Anesthesia Physical Anesthesia Plan  ASA: III  Anesthesia Plan: Spinal   Post-op Pain Management:    Induction:   Airway Management Planned: Simple Face Mask  Additional Equipment:   Intra-op Plan:   Post-operative Plan:   Informed Consent: I have reviewed the patients History and Physical, chart, labs and discussed the procedure including the risks, benefits and alternatives for the proposed anesthesia with the patient or authorized representative who has indicated his/her understanding and acceptance.   Dental Advisory Given  Plan Discussed with: CRNA and Surgeon  Anesthesia Plan Comments:       Anesthesia Quick Evaluation

## 2013-06-19 NOTE — Transfer of Care (Signed)
Immediate Anesthesia Transfer of Care Note  Patient: Chelsea Thornton  Procedure(s) Performed: Procedure(s) (LRB): HARDWARE REMOVAL LEFT KNEE  (Left)  Patient Location: PACU  Anesthesia Type: Spinal  Level of Consciousness: sedated, patient cooperative and responds to stimulation  Airway & Oxygen Therapy: Patient Spontanous Breathing and Patient connected to face mask oxgen  Post-op Assessment: Report given to PACU RN and Post -op Vital signs reviewed and stable  Post vital signs: Reviewed and stable  Complications: No apparent anesthesia complications

## 2013-06-19 NOTE — Anesthesia Procedure Notes (Signed)
Spinal  Patient location during procedure: OR Start time: 06/19/2013 4:07 PM End time: 06/19/2013 4:17 PM Staffing Anesthesiologist: Ronelle Nigh L Performed by: anesthesiologist  Preanesthetic Checklist Completed: patient identified, site marked, surgical consent, pre-op evaluation, timeout performed, IV checked, risks and benefits discussed and monitors and equipment checked Spinal Block Patient position: sitting Prep: Betadine Patient monitoring: heart rate, continuous pulse ox and blood pressure Approach: midline Location: L3-4 Injection technique: single-shot Needle Needle type: Sprotte  Needle gauge: 24 G Needle length: 9 cm Assessment Sensory level: T6 Additional Notes Expiration date of kit checked and confirmed. Patient tolerated procedure well, without complications.

## 2013-06-19 NOTE — Interval H&P Note (Signed)
History and Physical Interval Note:  06/19/2013 3:13 PM  Chelsea Thornton  has presented today for surgery, with the diagnosis of RETAINED HARDWARE LEFT KNEE   The various methods of treatment have been discussed with the patient and family. After consideration of risks, benefits and other options for treatment, the patient has consented to  Procedure(s): HARDWARE REMOVAL LEFT KNEE  (Left) as a surgical intervention .  The patient's history has been reviewed, patient examined, no change in status, stable for surgery.  I have reviewed the patient's chart and labs.  Questions were answered to the patient's satisfaction.     [name]

## 2013-06-19 NOTE — Plan of Care (Signed)
Problem: Consults Goal: Diagnosis- Total Joint Replacement Revision Total Knee     

## 2013-06-19 NOTE — Brief Op Note (Signed)
06/19/2013  5:59 PM  PATIENT:  Chelsea Thornton  62 y.o. female  PRE-OPERATIVE DIAGNOSIS:  RETAINED HARDWARE LEFT KNEE, post traumatic arthritis left knee  POST-OPERATIVE DIAGNOSIS:  RETAINED HARDWARE LEFT KNEE, post traumatic arthritis left knee   PROCEDURE:  Procedure(s): HARDWARE REMOVAL LEFT KNEE  (Left)  SURGEON:  Surgeon(s) and Role:    * [name], MD - Primary  PHYSICIAN ASSISTANT: Lanney Gins, PA-C  ANESTHESIA:   spinal  EBL:  Total I/O In: 1400 [I.V.:1400] Out: 600 [Urine:600]  BLOOD ADMINISTERED:none  DRAINS: none   LOCAL MEDICATIONS USED:  NONE  SPECIMEN:  No Specimen and Source of Specimen:  left leg fluid  DISPOSITION OF SPECIMEN:  PATHOLOGY  COUNTS:  YES  TOURNIQUET:   Total Tourniquet Time Documented: Thigh (Left) - 61 minutes Total: Thigh (Left) - 61 minutes   DICTATION: .Reubin Milan Dictation  PLAN OF CARE: Admit to inpatient   PATIENT DISPOSITION:  PACU - hemodynamically stable.   Delay start of Pharmacological VTE agent (>24hrs) due to surgical blood loss or risk of bleeding: no  Indication of procedure:  Chelsea Thornton is a 62 year old female with history of left tibial plateau fracture requiring open reduction and internal fixation.  Despite union of the fracture she has developed significant proximal tibia malunion associated with advanced osteoarthritic changes. She was referred for surgical consideration of a total knee arthroplasty. After reviewing the radiographic changes of her left proximal tibia, evaluated the location of her incision that was utilized to perform this procedure, I felt it was in our best interest to remove the hardware and an index procedure and once we knew the wound had healed we can proceed safely with a total knee arthroplasty. I reviewed with her the risks involved with this. Specifically the risks of infection, wound complications, and the need for future surgery. I have reviewed the initial visit the concerns I  had regarding removal of hardware associated with a primary total knee arthroplasty in the reasoning and justification of proceeding in this manner. Consent was obtained for the benefit of preparing the proximal tibia for total knee arthroplasty as well as evaluating for wound healing capabilities.  Procedure in detail: Chelsea Thornton 62 year old female was brought to the operative theater. Once adequate anesthesia and preoperative antibiotics were administered she was positioned supine. A left thigh tourniquet was placed. The left lower extremity was prepped and draped in sterile fashion. A timeout was performed identifying the patient planned procedure and extremity.  The left lower extremity was then exsanguinated and tourniquet elevated to 250 mm mercury. Her old incision was identified and demarcated on the skin. I then excised the skin and scar, created soft tissue planes. Attention was first directed to the lateral plate the fascia of the anterior lateral muscle compartment was incised identifying the plate distally. There was noted to be some bony overgrowth along the proximal aspect of the plate which was removed using osteotomes and rongeurs.  Once the plate was exposed all of the screws were removed from the vertical and horizontal limbs of the plate. The plate was then removed easily. I used rongeurs to remove further bone was lateral proximal tibia to further move the contours and prevent aggravation from exposed bone edges.  Once this lateral plate was removed attention was directed to the medial plate. Just medial to the tubercle I incised the capsular fashion this area exposing the proximal medial tibia.  The medial plate was noted to be fixed as a spring-type plate with 3 screws  placed in the distal segment of the plate. The 3 screws were removed and the plate subsequently removed  At this point I used pulse lavage and irrigated the wounds out. Please note that at the time of the initial  skin incision there was a fluid of suspicious appearance which most likely was related to fatty fatty tissue breakdown versus infection but nonetheless I took culture samples just in case. Following irrigation of the wound I reapproximated the medial capsular tissue with #1 Vicryl.  I then used a #0 V-lock suture on the lateral fascial plane. The remainder of the wound was closed with 2-0 Vicryl and a running 4-0 Monocryl stitch in the subcuticular layer.  The left leg was then cleaned and dried and dressed sterilely with Dermabond and an aqua cell dressing. The leg was then wrapped with an Ace wrap.  She was awoken from her gentle anesthesia as it was probably a spinal block anesthesia and taken to the recovery room in stable condition tolerating the procedure well  Despite the significant deformity and varus thrust of her knee as a result of her arthritis she will be permitted to be weightbearing as tolerated. She'll be seen back in the clinic in 2 weeks for wound evaluation to make certain that the wound has healed but otherwise is scheduled for total knee arthroplasty in December 2014.

## 2013-06-19 NOTE — Progress Notes (Signed)
Patient is crying when RN introduces herself. Patient states she believes she will die on the OR table. RN asks if there is a reason she feels this way but patient cannot explain this feeling. RN asks if she would like to have prayer with a chaplain. Her sister states that their chaplain will be coming at 1345 today.  Patient refuses to remove her underwear. RN does not argue.   Emotional reassurance given.

## 2013-06-19 NOTE — Anesthesia Postprocedure Evaluation (Signed)
  Anesthesia Post-op Note  Patient: Chelsea Thornton  Procedure(s) Performed: Procedure(s) (LRB): HARDWARE REMOVAL LEFT KNEE  (Left)  Patient Location: PACU  Anesthesia Type: Spinal  Level of Consciousness: awake and alert   Airway and Oxygen Therapy: Patient Spontanous Breathing  Post-op Pain: mild  Post-op Assessment: Post-op Vital signs reviewed, Patient's Cardiovascular Status Stable, Respiratory Function Stable, Patent Airway and No signs of Nausea or vomiting  Last Vitals:  Filed Vitals:   06/19/13 1941  BP: 147/80  Pulse: 91  Temp: 36.5 C  Resp: 15    Post-op Vital Signs: stable   Complications: No apparent anesthesia complications

## 2013-06-20 ENCOUNTER — Encounter (HOSPITAL_COMMUNITY): Payer: Self-pay | Admitting: Orthopedic Surgery

## 2013-06-20 DIAGNOSIS — E119 Type 2 diabetes mellitus without complications: Secondary | ICD-10-CM | POA: Diagnosis present

## 2013-06-20 DIAGNOSIS — E669 Obesity, unspecified: Secondary | ICD-10-CM | POA: Diagnosis present

## 2013-06-20 DIAGNOSIS — D500 Iron deficiency anemia secondary to blood loss (chronic): Secondary | ICD-10-CM | POA: Diagnosis not present

## 2013-06-20 LAB — BASIC METABOLIC PANEL
CO2: 27 mEq/L (ref 19–32)
Calcium: 8.4 mg/dL (ref 8.4–10.5)
Chloride: 100 mEq/L (ref 96–112)
Creatinine, Ser: 0.47 mg/dL — ABNORMAL LOW (ref 0.50–1.10)
GFR calc Af Amer: >90 mL/min (ref >90)
Glucose, Bld: 185 mg/dL — ABNORMAL HIGH (ref 70–99)

## 2013-06-20 LAB — CBC
Hemoglobin: 11 g/dL — ABNORMAL LOW (ref 12.0–15.0)
MCH: 28.6 pg (ref 26.0–34.0)
MCV: 84.6 fL (ref 78.0–100.0)
Platelets: 288 10*3/uL (ref 150–400)
RBC: 3.84 MIL/uL — ABNORMAL LOW (ref 3.87–5.11)
RDW: 13.1 % (ref 11.5–15.5)
WBC: 12.6 10*3/uL — ABNORMAL HIGH (ref 4.0–10.5)

## 2013-06-20 LAB — GLUCOSE, CAPILLARY: Glucose-Capillary: 239 mg/dL — ABNORMAL HIGH (ref 70–99)

## 2013-06-20 MED ORDER — DSS 100 MG PO CAPS: 100.0000 mg | ORAL_CAPSULE | Freq: Two times a day (BID) | ORAL | Status: DC

## 2013-06-20 MED ORDER — HYDROCODONE-ACETAMINOPHEN 7.5-325 MG PO TABS: 1.0000 | ORAL_TABLET | ORAL | Status: DC | PRN

## 2013-06-20 MED ORDER — ASPIRIN 325 MG PO TBEC: 325.0000 mg | DELAYED_RELEASE_TABLET | Freq: Two times a day (BID) | ORAL | Status: AC

## 2013-06-20 MED ORDER — METHOCARBAMOL 500 MG PO TABS: 500.0000 mg | ORAL_TABLET | Freq: Four times a day (QID) | ORAL | Status: DC | PRN

## 2013-06-20 MED ORDER — POLYETHYLENE GLYCOL 3350 17 G PO PACK: 17.0000 g | PACK | Freq: Two times a day (BID) | ORAL | Status: DC

## 2013-06-20 MED ORDER — FERROUS SULFATE 325 (65 FE) MG PO TABS: 325.0000 mg | ORAL_TABLET | Freq: Three times a day (TID) | ORAL | Status: DC

## 2013-06-20 NOTE — Progress Notes (Signed)
Physical Therapy Treatment Patient Details Name: Chelsea Thornton MRN: [id number] DOB: 16-Aug-1950 Today's Date: 06/20/2013 Time: 5784-6962 PT Time Calculation (min): 47 min  PT Assessment / Plan / Recommendation  History of Present Illness 62 yo female s/p L knee hardware removal, posttraumatic arthritis. Hx of L tib plateau fx 09/2012, DM, dizzy spells   PT Comments   Progressing well with therapy. Issued and reviewed exercise program. Plan is for home later today.   Follow Up Recommendations  No PT follow up (issued HEP )     Does the patient have the potential to tolerate intense rehabilitation     Barriers to Discharge        Equipment Recommendations  None recommended by PT    Recommendations for Other Services    Frequency 7X/week   Progress towards PT Goals Progress towards PT goals: Progressing toward goals  Plan Current plan remains appropriate    Precautions / Restrictions Precautions Precautions: Fall Required Braces or Orthoses: Knee Immobilizer - Left (for comfort per MD. Pt also has bledsoe brace she has. ) Restrictions Weight Bearing Restrictions: No LLE Weight Bearing: Weight bearing as tolerated   Pertinent Vitals/Pain Pt denies pain. Ice applied end of session    Mobility  Bed Mobility Bed Mobility: Not assessed Transfers Transfers: Sit to Stand;Stand to Sit Sit to Stand: 4: Min guard;From chair/3-in-1 Stand to Sit: 4: Min guard;To chair/3-in-1 Details for Transfer Assistance: VCs safety, technique, hand placement. Ambulation/Gait Ambulation/Gait Assistance: 4: Min guard Ambulation Distance (Feet): 165 Feet Assistive device: Rolling walker Ambulation/Gait Assistance Details: close guard for safety Gait Pattern: Step-to pattern;Decreased stride length;Antalgic;Trunk flexed    Exercises General Exercises - Lower Extremity Ankle Circles/Pumps: AROM;Both;15 reps;Seated Quad Sets: AROM;Both;15 reps;Seated Heel Slides: AAROM;Left;10 reps;Seated (with  pillowcase under foot-knee flexion) Hip ABduction/ADduction: AROM;Left;15 reps;Seated Straight Leg Raises: AROM;Left;15 reps;Seated   PT Diagnosis:    PT Problem List:   PT Treatment Interventions:     PT Goals (current goals can now be found in the care plan section)    Visit Information  Last PT Received On: 06/20/13 Assistance Needed: +1 History of Present Illness: 62 yo female s/p L knee hardware removal, posttraumatic arthritis. Hx of L tib plateau fx 09/2012, DM, dizzy spells    Subjective Data      Cognition       Balance     End of Session PT - End of Session Activity Tolerance: Patient tolerated treatment well Patient left: in chair;with call bell/phone within reach;with family/visitor present   GP     Rebeca Alert, MPT Pager: [phone]

## 2013-06-20 NOTE — Progress Notes (Signed)
   Subjective: 1 Day Post-Op Procedure(s) (LRB): HARDWARE REMOVAL LEFT KNEE  (Left)   Patient reports pain as mild, pain controlled. No events throughout the night. Ready to be discharged home if she does well with PT and pain stays well controlled.   Objective:   VITALS:   Filed Vitals:   06/20/13 0503  BP: 100/63  Pulse: 79  Temp: 97.9 F (36.6 C)  Resp: 18    Neurovascular intact Dorsiflexion/Plantar flexion intact Incision: dressing C/D/I No cellulitis present Compartment soft  LABS  Recent Labs  06/20/13 0415  HGB 11.0*  HCT 32.5*  WBC 12.6*  PLT 288     Recent Labs  06/20/13 0415  NA 134*  K 3.4*  BUN 14  CREATININE 0.47*  GLUCOSE 185*     Assessment/Plan: 1 Day Post-Op Procedure(s) (LRB): HARDWARE REMOVAL LEFT KNEE  (Left) Foley cath d/c'ed Advance diet Up with therapy D/C IV fluids Discharge home with home health Follow up in 2 weeks at [facility]. Follow up with OLIN,Chelsea Thornton D in 10-14 days.  Contact information:  [facility] [address] [place] Thornton [address] [phone]      Expected ABLA  Treated with iron and will observe  Obese (BMI 30-39.9) Estimated body mass index is 31.32 kg/(m^2) as calculated from the following:   Height as of this encounter: 5\' 7"  (1.702 m).   Weight as of this encounter: 90.719 kg (200 lb). Patient also counseled that weight may inhibit the healing process Patient counseled that losing weight will help with future health issues     Chelsea Thornton. Chelsea Thornton   PAC  06/20/2013, 8:31 AM

## 2013-06-20 NOTE — Progress Notes (Signed)
OT Cancellation Note  Patient Details Name: Lorma Heater MRN: [id number] DOB: 1951/03/21   Cancelled Treatment:    Reason Eval/Treat Not Completed: OT screened, no needs identified, will sign off.  Pt has had multiple surgeries and has assist and DME.  Jonatan Wilsey 06/20/2013, 10:21 AM Marica Otter, OTR/L [phone] 06/20/2013

## 2013-06-20 NOTE — Evaluation (Addendum)
Physical Therapy Evaluation Patient Details Name: Chelsea Thornton MRN: [id number] DOB: March 09, 1951 Today's Date: 06/20/2013 Time: 4098-1191 PT Time Calculation (min): 41 min  PT Assessment / Plan / Recommendation History of Present Illness  62 yo female s/p L knee hardware removal, posttraumatic arthritis. Hx of L tib plateau fx 09/2012, DM, dizzy spells  Clinical Impression  On eval, pt required Min guard to Min assist for mobility-able to ambulate ~100 feet with RW. Anticipate pt will progress well during stay. Recommend HHPT. Attempted to don pt's Bledsoe but unable due to dressing, swelling. Instead applied KI for comfort and instructed pt to use Bledsoe brace once able.     PT Assessment  Patient needs continued PT services    Follow Up Recommendations  No PT follow-up. (Plan is for TKA in December. Will make HEP for pt to follow at home).     Does the patient have the potential to tolerate intense rehabilitation      Barriers to Discharge        Equipment Recommendations  None recommended by PT    Recommendations for Other Services     Frequency 7X/week    Precautions / Restrictions Precautions Precautions: Fall Required Braces or Orthoses: Knee Immobilizer - Left Knee Immobilizer - Left:  (for comfort per MD. significant varus L knee) Restrictions Weight Bearing Restrictions: No   Pertinent Vitals/Pain Pt denies pain. Ice applied end of session      Mobility  Bed Mobility Bed Mobility: Supine to Sit Supine to Sit: 4: Min assist Details for Bed Mobility Assistance: Assist for L LE off bed.  Transfers Transfers: Sit to Stand;Stand to Sit Sit to Stand: From elevated surface;4: Min assist Stand to Sit: To chair/3-in-1;4: Min guard Details for Transfer Assistance: Assist to rise,stabilize. VCs safety, technique, hand placement. Ambulation/Gait Ambulation/Gait Assistance: 4: Min guard Ambulation Distance (Feet): 100 Feet Assistive device: Rolling  walker Ambulation/Gait Assistance Details: VCs safety, technique, sequence. slow gait speed. No LOB Gait Pattern: Step-to pattern;Decreased stride length;Antalgic;Trunk flexed    Exercises     PT Diagnosis: Difficulty walking;Abnormality of gait;Acute pain  PT Problem List: Decreased strength;Decreased range of motion;Decreased activity tolerance;Decreased mobility;Pain;Decreased knowledge of use of DME PT Treatment Interventions: DME instruction;Gait training;Functional mobility training;Therapeutic activities;Therapeutic exercise;Patient/family education     PT Goals(Current goals can be found in the care plan section) Acute Rehab PT Goals Patient Stated Goal: home today PT Goal Formulation: With patient Time For Goal Achievement: 06/27/13 Potential to Achieve Goals: Good  Visit Information  Last PT Received On: 06/20/13 Assistance Needed: +1 History of Present Illness: 62 yo female s/p L knee hardware removal, posttraumatic arthritis. Hx of L tib plateau fx 09/2012, DM, dizzy spells       Prior Functioning  Home Living Family/patient expects to be discharged to:: Private residence Living Arrangements: Alone Available Help at Discharge: Family (sister) Type of Home: House Home Access: Level entry Entrance Stairs-Rails: None Home Layout: One level Home Equipment: Environmental consultant - 2 wheels;Cane - single point Prior Function Level of Independence: Independent with assistive device(s) Comments: cane in house, walker outside Communication Communication: No difficulties    Cognition  Cognition Arousal/Alertness: Awake/alert Behavior During Therapy: WFL for tasks assessed/performed Overall Cognitive Status: Within Functional Limits for tasks assessed    Extremity/Trunk Assessment Upper Extremity Assessment Upper Extremity Assessment: Overall WFL for tasks assessed Lower Extremity Assessment Lower Extremity Assessment: LLE deficits/detail LLE Deficits / Details: hip flex 3-/5,knee  ext 3-/5, moves ankle well Cervical / Trunk Assessment Cervical /  Trunk Assessment: Normal   Balance    End of Session PT - End of Session Activity Tolerance: Patient tolerated treatment well (Somewhat limited by nausea) Patient left: in chair;with call bell/phone within reach  GP     Rebeca Alert, MPT Pager: [phone]

## 2013-06-20 NOTE — Care Management Note (Signed)
    Page 1 of 1   06/20/2013     6:43:17 PM   CARE MANAGEMENT NOTE 06/20/2013  Patient:  Chelsea Thornton, Chelsea Thornton   Account Number:  [id number]  Date Initiated:  06/20/2013  Documentation initiated by:  Colleen Can  Subjective/Objective Assessment:   dx Left knee hardware removal     Action/Plan:   CM spoke with patient. Plan are for patient to return to her home in [place] where sister will be caregiver. She already has DME. States she will not need  HHPt. No recommendations for HHpt by PT   Anticipated DC Date:  06/20/2013   Anticipated DC Plan:  HOME/SELF CARE      DC Planning Services  CM consult      Choice offered to / List presented to:          [facility] arranged  HH - 11 Patient Refused      Status of service:  Completed, signed off Medicare Important Message given?   (If response is "NO", the following Medicare IM given date fields will be blank) Date Medicare IM given:   Date Additional Medicare IM given:    Discharge Disposition:  HOME/SELF CARE  Per UR Regulation:    If discussed at Long Length of Stay Meetings, dates discussed:    Comments:

## 2013-06-22 LAB — TISSUE CULTURE
Culture: NO GROWTH
Gram Stain: NONE SEEN

## 2013-06-23 NOTE — Discharge Summary (Signed)
Physician Discharge Summary  Patient ID: Chelsea Thornton MRN: [id number] DOB/AGE: 04/15/1951 62 y.o.  Admit date: 06/19/2013 Discharge date: 06/20/2013   Procedures:  Procedure(s) (LRB): HARDWARE REMOVAL LEFT KNEE  (Left)  Attending Physician:  Dr. 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  Discharge Diagnoses:  Principal Problem:   S/P hardware removal left knee Active Problems:   Expected blood loss anemia   Obese   Diabetes  Past Medical History  Diagnosis Date  . Diabetes mellitus   . Hyperlipidemia   . Pneumonia   . Abnormal CXR 09/24/2012    Dr. Oneita Hurt like little clouds"-was told is improved.Denies SOB or trouble breathing  . Heart attack     '12-"dizzy spells"    HPI: Pt is a 62 y.o. female with a history of left medial tibial plateau fracture fixed in February of 2014 by Dr. Valma Cava requiring medial and lateral proximal tibial plates. She has had persistent pain and dysfunction in this left knee since that time. She has been treated by Dr. Thomasena Edis to the point of union of the proximal tibia as much as could be possible but there is significant malunion and posttraumatic arthritis with prominent and painful hardware. She has a current knee brace to try to provide some stability to the knee. She is requiring to use a walker. She is very much at this point interested in trying to retain her quality of life back. She has been a patient of Dr. Thomasena Edis for some time and has always appreciated all that he has ever done for her. She uses a walker to get around. She has a big knee brace on her leg to provide some support. She has a varus thrust with ambulation. She has a single lateral based incision over her knee and across the knee up into the distal thigh and extending laterally over the leg. She is tender on the  medial side of the knee as well as laterally. There are no signs of infection or cellulitis currently. She does have adipose tissue in the lower extremity. The plan is as follows, she wishes very much to try to improve her overall quality of life and functional mobilities and thus wold like to proceed with surgical intervention. This will need to be done in a staged procedure with the first procedure to remove hardware and after the wound has healed to proceed with a total knee arthroplasty. Risks, benefits and expectations were discussed with the patient. Risks including but not limited to the risk of anesthesia, blood clots, nerve damage, blood vessel damage, failure of the prosthesis, infection and up to and including death. Patient understand the risks, benefits and expectations and wishes to proceed with surgery.   PCP: [name], MD   Discharged Condition: good  Hospital Course:  Patient underwent the above stated procedure on 06/19/2013. Patient tolerated the procedure well and brought to the recovery room in good condition and subsequently to the floor.  POD #1 BP: 100/63 ; Pulse: 79 ; Temp: 97.9 F (36.6 C) ; Resp: 18  Pt's foley was removed, as well as the hemovac drain removed. IV was changed to a saline lock. Patient reports pain as mild, pain controlled. No events throughout the night. Ready to be discharged home.  Neurovascular intact, dorsiflexion/plantar flexion intact, incision: dressing C/D/I, no cellulitis present and compartment soft.   LABS  Basename  HGB  11.0  HCT  32.5    Discharge Exam: General appearance: alert, cooperative and no distress Extremities: Homans sign is negative, no sign of DVT, no edema, redness or tenderness in the calves or thighs and no ulcers, gangrene or trophic changes  Disposition:    Home or Self Care with follow up in 2 weeks   Follow-up Information   Follow up with [name], MD. Schedule an appointment as soon as possible for a  visit in 2 weeks.   Specialty:  Orthopedic Surgery   Contact information:   [address] [place] [address] [phone]       Discharge Orders   Future Orders Complete By Expires   Call MD / Call 911  As directed    Comments:     If you experience chest pain or shortness of breath, CALL 911 and be transported to the hospital emergency room.  If you develope a fever above 101 F, pus (white drainage) or increased drainage or redness at the wound, or calf pain, call your surgeon's office.   Constipation Prevention  As directed    Comments:     Drink plenty of fluids.  Prune juice may be helpful.  You may use a stool softener, such as Colace (over the counter) 100 mg twice a day.  Use MiraLax (over the counter) for constipation as needed.   Diet - low sodium heart healthy  As directed    Discharge instructions  As directed    Comments:     Maintain surgical dressing for 10-14 days, then replace with gauze and tape. Keep the area dry and clean until follow up. Follow up in 2 weeks at [facility]. Call with any questions or concerns.   Driving restrictions  As directed    Comments:     No driving for 4 weeks   Increase activity slowly as tolerated  As directed    TED hose  As directed    Comments:     Use stockings (TED hose) for 2 weeks on both leg(s).  You may remove them at night for sleeping.   Weight bearing as tolerated  As directed    Questions:     Laterality:     Extremity:          Medication List         alendronate 70 MG tablet  Commonly known as:  FOSAMAX  Take 70 mg by mouth once a week. Take with a full glass of water on an empty stomach.     aspirin 325 MG EC tablet  Take 1 tablet (325 mg total) by mouth 2 (two) times daily.     CALCIUM 600 + D PO  Take 1 tablet by mouth 3 (three) times daily.     DSS 100 MG Caps  Take 100 mg by mouth 2 (two) times daily.     ferrous sulfate 325 (65 FE) MG tablet  Take 1 tablet (325 mg  total) by mouth 3 (three) times daily after meals.     glimepiride 2 MG tablet  Commonly known as:  AMARYL  Take 1 tablet by mouth daily with breakfast.     HYDROcodone-acetaminophen 7.5-325 MG per tablet  Commonly known as:  NORCO  Take 1-2 tablets by mouth every 4 (four) hours as needed for moderate pain.     metFORMIN 500 MG 24 hr tablet  Commonly known as:  GLUCOPHAGE-XR  Take 1 tablet by mouth daily.  methocarbamol 500 MG tablet  Commonly known as:  ROBAXIN  Take 1 tablet (500 mg total) by mouth every 6 (six) hours as needed for muscle spasms.     polyethylene glycol packet  Commonly known as:  MIRALAX / GLYCOLAX  Take 17 g by mouth 2 (two) times daily.         Signed: Anastasio Auerbach. Makensie Mulhall   PAC  06/23/2013, 11:32 AM

## 2013-06-24 LAB — ANAEROBIC CULTURE: Gram Stain: NONE SEEN

## 2013-07-18 NOTE — Progress Notes (Signed)
Surgery scheduled for 08/07/13.  Preop on 12/17 at 0800am.  Need orders in EPIC.  Thank You.

## 2013-07-24 ENCOUNTER — Other Ambulatory Visit (HOSPITAL_COMMUNITY): Payer: Self-pay | Admitting: Orthopedic Surgery

## 2013-07-24 NOTE — Progress Notes (Addendum)
CHEST 2 VIEW XRAY 01-03-13 [facility] LOV DR MUNLEY 01-05-13, EKG 01-05-13 DR Dulce Sellar, HEART CATH 07-07-11 [facility] IN [facility] UNDER ENCOUNTER 06-19-2013  [facility] 6 EAST ENCOUNTER LEVEL DOCUMENTS Stress test 06-16-13 [place] [facility] on chart, ekg 09-19-12 [place] [facility] on chart

## 2013-07-24 NOTE — Patient Instructions (Addendum)
20 Chelsea Thornton  07/24/2013   Your procedure is scheduled on: Monday December 29th 2014  Report to [facility] at 530 AM.  Call this number if you have problems the morning of surgery [phone]   Remember:   Do not eat food or drink liquids :After Midnight.   Take these medicines the morning of surgery with A SIP OF WATER: no medications  to take                                SEE [place] PREPARING FOR SURGERY SHEET             You may not have any metal on your body including hair pins and piercings  Do not wear jewelry, make-up.  Do not wear lotions, powders, or perfumes. You may wear deodorant.   Men may shave face and neck.  Do not bring valuables to the hospital. [facility] IS NOT RESPONSIBLE FOR VALUEABLES.  Contacts, dentures or bridgework may not be worn into surgery.  Leave suitcase in the car. After surgery it may be brought to your room.  For patients admitted to the hospital, checkout time is 11:00 AM the day of discharge.   Patients discharged the day of surgery will not be allowed to drive home.  Name and phone number of your driver:  Special Instructions: N/A   Please read over the following fact sheets that you were given: MRSA INFORMATION, BLOOD FACT SHEET, INCENTIVE SPIROMETER SHEET  Call Cain Sieve RN pre op nurse if needed [phone]    FAILURE TO FOLLOW THESE INSTRUCTIONS MAY RESULT IN THE CANCELLATION OF YOUR SURGERY.  PATIENT SIGNATURE___________________________________________  NURSE SIGNATURE_____________________________________________

## 2013-07-26 ENCOUNTER — Encounter (HOSPITAL_COMMUNITY): Payer: Self-pay | Admitting: Pharmacy Technician

## 2013-07-26 ENCOUNTER — Encounter (HOSPITAL_COMMUNITY): Payer: Self-pay

## 2013-07-26 ENCOUNTER — Encounter (HOSPITAL_COMMUNITY)
Admission: RE | Admit: 2013-07-26 | Discharge: 2013-07-26 | Disposition: A | Discharge status: Home / Self Care | Payer: Commercial Managed Care - PPO | Source: Ambulatory Visit | Attending: Orthopedic Surgery | Admitting: Orthopedic Surgery

## 2013-07-26 ENCOUNTER — Encounter (INDEPENDENT_AMBULATORY_CARE_PROVIDER_SITE_OTHER): Payer: Self-pay

## 2013-07-26 DIAGNOSIS — M12569 Traumatic arthropathy, unspecified knee: Secondary | ICD-10-CM | POA: Insufficient documentation

## 2013-07-26 DIAGNOSIS — Z01812 Encounter for preprocedural laboratory examination: Secondary | ICD-10-CM | POA: Insufficient documentation

## 2013-07-26 HISTORY — DX: Nausea with vomiting, unspecified: Z98.890

## 2013-07-26 HISTORY — DX: Other complications of anesthesia, initial encounter: T88.59XA

## 2013-07-26 HISTORY — DX: Nausea with vomiting, unspecified: R11.2

## 2013-07-26 HISTORY — DX: Adverse effect of unspecified anesthetic, initial encounter: T41.45XA

## 2013-07-26 LAB — BASIC METABOLIC PANEL
BUN: 15 mg/dL (ref 6–23)
CO2: 29 mEq/L (ref 19–32)
Calcium: 9.3 mg/dL (ref 8.4–10.5)
Creatinine, Ser: 0.41 mg/dL — ABNORMAL LOW (ref 0.50–1.10)
GFR calc Af Amer: >90 mL/min (ref >90)
GFR calc non Af Amer: >90 mL/min (ref >90)
Glucose, Bld: 137 mg/dL — ABNORMAL HIGH (ref 70–99)
Potassium: 4.3 mEq/L (ref 3.5–5.1)
Sodium: 137 mEq/L (ref 135–145)

## 2013-07-26 LAB — CBC
MCH: 27.5 pg (ref 26.0–34.0)
MCHC: 32.2 g/dL (ref 30.0–36.0)
Platelets: 421 10*3/uL — ABNORMAL HIGH (ref 150–400)
RBC: 4.29 MIL/uL (ref 3.87–5.11)
RDW: 12.9 % (ref 11.5–15.5)

## 2013-07-26 LAB — URINALYSIS, ROUTINE W REFLEX MICROSCOPIC
Bilirubin Urine: NEGATIVE
Hgb urine dipstick: NEGATIVE
Ketones, ur: NEGATIVE mg/dL
Nitrite: NEGATIVE
Protein, ur: NEGATIVE mg/dL
Specific Gravity, Urine: 1.017 (ref 1.005–1.030)

## 2013-07-26 LAB — URINE MICROSCOPIC-ADD ON

## 2013-07-26 LAB — PROTIME-INR
INR: 0.93 (ref 0.00–1.49)
Prothrombin Time: 12.3 seconds (ref 11.6–15.2)

## 2013-07-31 NOTE — H&P (Signed)
TOTAL KNEE ADMISSION H&P  Patient is being admitted for left total knee arthroplasty.  Subjective:  Chief Complaint:     Left knee OA / pain.  HPI: Chelsea Thornton, 62 y.o. female, has a history of pain and functional disability in the left knee due to trauma, tibial plateau fracture in January 2014, and has failed non-surgical conservative treatments for greater than 12 weeks to includeNSAID's and/or analgesics, supervised PT with diminished ADL's post treatment, use of assistive devices and activity modification.  Onset of symptoms was abrupt, starting <1 years ago with gradually worsening course since that time. The patient noted prior procedures on the knee to include  ORIF of tibial plateau fracture on the left knee(s).  Patient currently rates pain in the left knee(s) at 9 out of 10 with activity. Patient has night pain, worsening of pain with activity and weight bearing, pain that interferes with activities of daily living, pain with passive range of motion, crepitus and joint swelling.  Patient has evidence of periarticular osteophytes and joint space narrowing by imaging studies.  There is no active infection.  Risks, benefits and expectations were discussed with the patient.  Risks including but not limited to the risk of anesthesia, blood clots, nerve damage, blood vessel damage, failure of the prosthesis, infection and up to and including death.  Patient understand the risks, benefits and expectations and wishes to proceed with surgery.   D/C Plans:   Home with HHPT  Post-op Meds:   No Rx given  Tranexamic Acid:     Not to be given - previous MI  Decadron:    Not to be given - DM  FYI:    ASA post-op    Patient Active Problem List   Diagnosis Date Noted  . Expected blood loss anemia 06/20/2013  . Obese 06/20/2013  . Diabetes 06/20/2013  . S/P hardware removal left knee 06/19/2013  . DM (diabetes mellitus) 09/23/2012  . 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 Diabetes mellitus   . Hyperlipidemia   . Abnormal CXR 09/24/2012    Dr. Oneita Hurt like little clouds"-was told is improved.Denies SOB or trouble breathing  . Heart attack     '12-"dizzy spells"  . Pneumonia yrs ago  . Complication of anesthesia 06-19-13 left shoulder and arm hurt upon awakening and for several days  . PONV (postoperative nausea and vomiting)     Past Surgical History  Procedure Laterality Date  . Total knee arthroplasty  2007    Right  . Video bronchoscopy  11/03/2011    Procedure: VIDEO BRONCHOSCOPY WITHOUT FLUORO;  Surgeon: Barbaraann Share, MD;  Location: [facility];  Service: Cardiopulmonary;  Laterality: Bilateral;  . Orif tibia plateau Left 09/25/2012    Procedure: OPEN REDUCTION INTERNAL FIXATION (ORIF) TIBIAL PLATEAU;  Surgeon: Eugenia Mcalpine, MD;  Location: [facility];  Service: Orthopedics;  Laterality: Left;  . Cardiac catheterization      '12- stent placed-[facility]  . Toe surgery Left 06-16-13    Big toe and 2nd toe- tip removed -2 yrs ago, to clear infection  . Hardware removal Left 06/19/2013    Procedure: HARDWARE REMOVAL LEFT KNEE ;  Surgeon: [name], MD;  Location: [facility];  Service: Orthopedics;  Laterality: Left;  . Left foot surgery  2012    tip end of 2nd toe removed    No prescriptions prior to admission   Allergies  Allergen Reactions  . Shrimp [Shellfish Allergy]  Anaphylaxis    Throat swells hives  . Doxycycline Nausea And Vomiting  . Prednisone Other (See Comments)    Patient states that she can take this medication now    History  Substance Use Topics  . Smoking status: Never Smoker   . Smokeless tobacco: Never Used  . Alcohol Use: No    No family history on file.   Review of Systems  Constitutional: Negative.   HENT: Negative.   Eyes: Negative.   Respiratory: Negative.   Cardiovascular: Negative.   Gastrointestinal: Negative.   Genitourinary: Negative.   Musculoskeletal: Positive for joint  pain.  Skin: Negative.   Neurological: Negative.   Endo/Heme/Allergies: Negative.   Psychiatric/Behavioral: Negative.     Objective:  Physical Exam  Constitutional: She is oriented to person, place, and time. She appears well-developed and well-nourished.  HENT:  Head: Normocephalic and atraumatic.  Mouth/Throat: Oropharynx is clear and moist.  Eyes: Pupils are equal, round, and reactive to light.  Neck: Neck supple. No JVD present. No tracheal deviation present. No thyromegaly present.  Cardiovascular: Normal rate, regular rhythm, normal heart sounds and intact distal pulses.   Respiratory: Effort normal and breath sounds normal. No respiratory distress. She has no wheezes.  GI: Soft. There is no tenderness. There is no guarding.  Musculoskeletal:       Left knee: She exhibits decreased range of motion, swelling, effusion, laceration (healed) and bony tenderness. She exhibits no ecchymosis, no deformity, no erythema and normal alignment. Tenderness found.  Lymphadenopathy:    She has no cervical adenopathy.  Neurological: She is alert and oriented to person, place, and time.  Skin: Skin is warm and dry.  Psychiatric: She has a normal mood and affect.    Labs:  Estimated body mass index is 32.30 kg/(m^2) as calculated from the following:   Height as of 01/03/13: 5\' 6"  (1.676 m).   Weight as of 06/19/13: 90.719 kg (200 lb).   Imaging Review Plain radiographs demonstrate severe degenerative joint disease of the left knee(s). The overall alignment is  neutral. The bone quality appears to be good for age and reported activity level.  Assessment/Plan:  End stage arthritis, left knee   The patient history, physical examination, clinical judgment of the provider and imaging studies are consistent with end stage degenerative joint disease of the left knee(s) and total knee arthroplasty is deemed medically necessary. The treatment options including medical management, injection  therapy arthroscopy and arthroplasty were discussed at length. The risks and benefits of total knee arthroplasty were presented and reviewed. The risks due to aseptic loosening, infection, stiffness, patella tracking problems, thromboembolic complications and other imponderables were discussed. The patient acknowledged the explanation, agreed to proceed with the plan and consent was signed. Patient is being admitted for inpatient treatment for surgery, pain control, PT, OT, prophylactic antibiotics, VTE prophylaxis, progressive ambulation and ADL's and discharge planning. The patient is planning to be discharged home with home health services.    Anastasio Auerbach Alta Shober   PAC  07/31/2013, 9:38 AM

## 2013-08-06 NOTE — Anesthesia Preprocedure Evaluation (Addendum)
Anesthesia Evaluation  Patient identified by MRN, date of birth, ID band Patient awake    Reviewed: Allergy & Precautions, H&P , NPO status , Patient's Chart, lab work & pertinent test results  History of Anesthesia Complications (+) PONV and history of anesthetic complications  Airway Mallampati: II TM Distance: >3 FB Neck ROM: full    Dental no notable dental hx. (+) Teeth Intact and Dental Advisory Given   Pulmonary pneumonia -, resolved,  Pneumonia 2/14 breath sounds clear to auscultation  Pulmonary exam normal       Cardiovascular Exercise Tolerance: Good + Past MI and + Cardiac Stents Rhythm:regular Rate:Normal  MI and stent 2012   Neuro/Psych negative neurological ROS  negative psych ROS   GI/Hepatic negative GI ROS, Neg liver ROS,   Endo/Other  diabetes, Well Controlled, Type 2, Oral Hypoglycemic Agents  Renal/GU negative Renal ROS     Musculoskeletal   Abdominal   Peds  Hematology negative hematology ROS (+) anemia ,   Anesthesia Other Findings   Reproductive/Obstetrics negative OB ROS                           Anesthesia Physical  Anesthesia Plan  ASA: III  Anesthesia Plan: Spinal   Post-op Pain Management:    Induction:   Airway Management Planned: Simple Face Mask  Additional Equipment:   Intra-op Plan:   Post-operative Plan:   Informed Consent: I have reviewed the patients History and Physical, chart, labs and discussed the procedure including the risks, benefits and alternatives for the proposed anesthesia with the patient or authorized representative who has indicated his/her understanding and acceptance.   Dental Advisory Given  Plan Discussed with: CRNA and Surgeon  Anesthesia Plan Comments:         Anesthesia Quick Evaluation

## 2013-08-07 ENCOUNTER — Encounter (HOSPITAL_COMMUNITY): Payer: Self-pay | Admitting: *Deleted

## 2013-08-07 ENCOUNTER — Inpatient Hospital Stay (HOSPITAL_COMMUNITY)
Admission: RE | Admit: 2013-08-07 | Discharge: 2013-08-09 | DRG: 470 | Disposition: A | Discharge status: Home Health | Payer: Commercial Managed Care - PPO | Source: Ambulatory Visit | Attending: Orthopedic Surgery | Admitting: Orthopedic Surgery

## 2013-08-07 ENCOUNTER — Encounter (HOSPITAL_COMMUNITY): Payer: Commercial Managed Care - PPO | Admitting: Anesthesiology

## 2013-08-07 ENCOUNTER — Inpatient Hospital Stay (HOSPITAL_COMMUNITY): Payer: Commercial Managed Care - PPO | Admitting: Anesthesiology

## 2013-08-07 ENCOUNTER — Encounter (HOSPITAL_COMMUNITY)
Admission: RE | Disposition: A | Discharge status: Home Health | Payer: Self-pay | Source: Ambulatory Visit | Attending: Orthopedic Surgery

## 2013-08-07 DIAGNOSIS — Z01812 Encounter for preprocedural laboratory examination: Secondary | ICD-10-CM

## 2013-08-07 DIAGNOSIS — X58XXXS Exposure to other specified factors, sequela: Secondary | ICD-10-CM

## 2013-08-07 DIAGNOSIS — E119 Type 2 diabetes mellitus without complications: Secondary | ICD-10-CM | POA: Diagnosis present

## 2013-08-07 DIAGNOSIS — D62 Acute posthemorrhagic anemia: Secondary | ICD-10-CM | POA: Diagnosis not present

## 2013-08-07 DIAGNOSIS — IMO0002 Reserved for concepts with insufficient information to code with codable children: Secondary | ICD-10-CM | POA: Diagnosis present

## 2013-08-07 DIAGNOSIS — I252 Old myocardial infarction: Secondary | ICD-10-CM

## 2013-08-07 DIAGNOSIS — S8290XS Unspecified fracture of unspecified lower leg, sequela: Secondary | ICD-10-CM

## 2013-08-07 DIAGNOSIS — E785 Hyperlipidemia, unspecified: Secondary | ICD-10-CM | POA: Diagnosis present

## 2013-08-07 DIAGNOSIS — M21869 Other specified acquired deformities of unspecified lower leg: Secondary | ICD-10-CM | POA: Diagnosis present

## 2013-08-07 DIAGNOSIS — D500 Iron deficiency anemia secondary to blood loss (chronic): Secondary | ICD-10-CM | POA: Diagnosis present

## 2013-08-07 DIAGNOSIS — E669 Obesity, unspecified: Secondary | ICD-10-CM | POA: Diagnosis present

## 2013-08-07 DIAGNOSIS — Z96652 Presence of left artificial knee joint: Secondary | ICD-10-CM

## 2013-08-07 DIAGNOSIS — M12569 Traumatic arthropathy, unspecified knee: Principal | ICD-10-CM | POA: Diagnosis present

## 2013-08-07 DIAGNOSIS — Z96659 Presence of unspecified artificial knee joint: Secondary | ICD-10-CM

## 2013-08-07 DIAGNOSIS — Z6832 Body mass index (BMI) 32.0-32.9, adult: Secondary | ICD-10-CM

## 2013-08-07 HISTORY — PX: TOTAL KNEE ARTHROPLASTY: SHX125

## 2013-08-07 LAB — GLUCOSE, CAPILLARY: Glucose-Capillary: 275 mg/dL — ABNORMAL HIGH (ref 70–99)

## 2013-08-07 LAB — TYPE AND SCREEN

## 2013-08-07 SURGERY — ARTHROPLASTY, KNEE, TOTAL
Anesthesia: Spinal | Site: Knee | Laterality: Left

## 2013-08-07 MED ORDER — SODIUM CHLORIDE 0.9 % IJ SOLN
INTRAMUSCULAR | Status: AC
  Filled 2013-08-07: qty 10

## 2013-08-07 MED ORDER — ZOLPIDEM TARTRATE 5 MG PO TABS: 5.0000 mg | ORAL_TABLET | Freq: Every evening | ORAL | Status: DC | PRN

## 2013-08-07 MED ORDER — KETOROLAC TROMETHAMINE 30 MG/ML IJ SOLN
INTRAMUSCULAR | Status: AC
  Filled 2013-08-07: qty 1

## 2013-08-07 MED ORDER — HYDROMORPHONE HCL PF 1 MG/ML IJ SOLN
0.5000 mg | INTRAMUSCULAR | Status: DC | PRN
  Administered 2013-08-07: 12:00:00 0.5 mg via INTRAVENOUS
  Filled 2013-08-07: qty 1

## 2013-08-07 MED ORDER — GLIMEPIRIDE 2 MG PO TABS
2.0000 mg | ORAL_TABLET | Freq: Every day | ORAL | Status: DC
  Administered 2013-08-08 – 2013-08-09 (×2): 2 mg via ORAL
  Filled 2013-08-07 (×3): qty 1

## 2013-08-07 MED ORDER — CEFAZOLIN SODIUM-DEXTROSE 2-3 GM-% IV SOLR
2.0000 g | INTRAVENOUS | Status: AC
  Administered 2013-08-07: 2 g via INTRAVENOUS

## 2013-08-07 MED ORDER — PROMETHAZINE HCL 25 MG/ML IJ SOLN: 6.2500 mg | INTRAMUSCULAR | Status: DC | PRN

## 2013-08-07 MED ORDER — CEFAZOLIN SODIUM-DEXTROSE 2-3 GM-% IV SOLR
INTRAVENOUS | Status: AC
  Filled 2013-08-07: qty 50

## 2013-08-07 MED ORDER — OXYCODONE HCL 5 MG/5ML PO SOLN
5.0000 mg | Freq: Once | ORAL | Status: DC | PRN
  Filled 2013-08-07: qty 5

## 2013-08-07 MED ORDER — EPHEDRINE SULFATE 50 MG/ML IJ SOLN
INTRAMUSCULAR | Status: DC | PRN
  Administered 2013-08-07 (×3): 5 mg via INTRAVENOUS

## 2013-08-07 MED ORDER — PROPOFOL INFUSION 10 MG/ML OPTIME
INTRAVENOUS | Status: DC | PRN
  Administered 2013-08-07: 50 ug/kg/min via INTRAVENOUS

## 2013-08-07 MED ORDER — EPHEDRINE SULFATE 50 MG/ML IJ SOLN
INTRAMUSCULAR | Status: AC
  Filled 2013-08-07: qty 1

## 2013-08-07 MED ORDER — PROPOFOL 10 MG/ML IV BOLUS
INTRAVENOUS | Status: AC
  Filled 2013-08-07: qty 20

## 2013-08-07 MED ORDER — METHOCARBAMOL 500 MG PO TABS
500.0000 mg | ORAL_TABLET | Freq: Four times a day (QID) | ORAL | Status: DC | PRN
  Administered 2013-08-09: 500 mg via ORAL
  Filled 2013-08-07: qty 1

## 2013-08-07 MED ORDER — FERROUS SULFATE 325 (65 FE) MG PO TABS
325.0000 mg | ORAL_TABLET | Freq: Three times a day (TID) | ORAL | Status: DC
  Administered 2013-08-07 – 2013-08-09 (×5): 325 mg via ORAL
  Filled 2013-08-07 (×8): qty 1

## 2013-08-07 MED ORDER — METOCLOPRAMIDE HCL 10 MG PO TABS: 5.0000 mg | ORAL_TABLET | Freq: Three times a day (TID) | ORAL | Status: DC | PRN

## 2013-08-07 MED ORDER — ALUM & MAG HYDROXIDE-SIMETH 200-200-20 MG/5ML PO SUSP: 30.0000 mL | ORAL | Status: DC | PRN

## 2013-08-07 MED ORDER — SODIUM CHLORIDE 0.9 % IV SOLN
INTRAVENOUS | Status: DC
  Administered 2013-08-07 – 2013-08-08 (×3): via INTRAVENOUS
  Filled 2013-08-07 (×10): qty 1000

## 2013-08-07 MED ORDER — METOCLOPRAMIDE HCL 5 MG/ML IJ SOLN
INTRAMUSCULAR | Status: DC | PRN
  Administered 2013-08-07: 10 mg via INTRAVENOUS

## 2013-08-07 MED ORDER — PHENYLEPHRINE HCL 10 MG/ML IJ SOLN
INTRAMUSCULAR | Status: DC | PRN
  Administered 2013-08-07 (×3): 40 ug via INTRAVENOUS

## 2013-08-07 MED ORDER — KETOROLAC TROMETHAMINE 30 MG/ML IJ SOLN
INTRAMUSCULAR | Status: DC | PRN
  Administered 2013-08-07: 30 mg

## 2013-08-07 MED ORDER — 0.9 % SODIUM CHLORIDE (POUR BTL) OPTIME
TOPICAL | Status: DC | PRN
  Administered 2013-08-07: 1000 mL

## 2013-08-07 MED ORDER — INSULIN ASPART 100 UNIT/ML ~~LOC~~ SOLN
0.0000 [IU] | Freq: Three times a day (TID) | SUBCUTANEOUS | Status: DC
  Administered 2013-08-07: 8 [IU] via SUBCUTANEOUS
  Administered 2013-08-08 (×2): 3 [IU] via SUBCUTANEOUS
  Administered 2013-08-08: 5 [IU] via SUBCUTANEOUS
  Administered 2013-08-09 (×2): 3 [IU] via SUBCUTANEOUS

## 2013-08-07 MED ORDER — PNEUMOCOCCAL VAC POLYVALENT 25 MCG/0.5ML IJ INJ
0.5000 mL | INJECTION | INTRAMUSCULAR | Status: DC
  Filled 2013-08-07 (×2): qty 0.5

## 2013-08-07 MED ORDER — HYDROMORPHONE HCL PF 1 MG/ML IJ SOLN: 0.2500 mg | INTRAMUSCULAR | Status: DC | PRN

## 2013-08-07 MED ORDER — FENTANYL CITRATE 0.05 MG/ML IJ SOLN
INTRAMUSCULAR | Status: AC
  Filled 2013-08-07: qty 2

## 2013-08-07 MED ORDER — ATROPINE SULFATE 0.4 MG/ML IJ SOLN
INTRAMUSCULAR | Status: AC
  Filled 2013-08-07: qty 1

## 2013-08-07 MED ORDER — OXYCODONE HCL 5 MG PO TABS: 5.0000 mg | ORAL_TABLET | Freq: Once | ORAL | Status: DC | PRN

## 2013-08-07 MED ORDER — CEFAZOLIN SODIUM-DEXTROSE 2-3 GM-% IV SOLR
2.0000 g | Freq: Four times a day (QID) | INTRAVENOUS | Status: AC
  Administered 2013-08-07 (×2): 2 g via INTRAVENOUS
  Filled 2013-08-07 (×2): qty 50

## 2013-08-07 MED ORDER — MEPERIDINE HCL 50 MG/ML IJ SOLN: 6.2500 mg | INTRAMUSCULAR | Status: DC | PRN

## 2013-08-07 MED ORDER — MIDAZOLAM HCL 2 MG/2ML IJ SOLN
INTRAMUSCULAR | Status: AC
  Filled 2013-08-07: qty 2

## 2013-08-07 MED ORDER — MENTHOL 3 MG MT LOZG
1.0000 | LOZENGE | OROMUCOSAL | Status: DC | PRN
  Filled 2013-08-07: qty 9

## 2013-08-07 MED ORDER — BISACODYL 10 MG RE SUPP: 10.0000 mg | Freq: Every day | RECTAL | Status: DC | PRN

## 2013-08-07 MED ORDER — DOCUSATE SODIUM 100 MG PO CAPS
100.0000 mg | ORAL_CAPSULE | Freq: Two times a day (BID) | ORAL | Status: DC
  Administered 2013-08-07 – 2013-08-09 (×4): 100 mg via ORAL

## 2013-08-07 MED ORDER — HYDROCODONE-ACETAMINOPHEN 7.5-325 MG PO TABS
1.0000 | ORAL_TABLET | ORAL | Status: DC
  Administered 2013-08-07: 12:00:00 1 via ORAL
  Administered 2013-08-07 – 2013-08-08 (×5): 2 via ORAL
  Administered 2013-08-08: 1 via ORAL
  Administered 2013-08-08: 20:00:00 2 via ORAL
  Administered 2013-08-09 (×2): 1 via ORAL
  Administered 2013-08-09 (×2): 2 via ORAL
  Filled 2013-08-07: qty 2
  Filled 2013-08-07 (×2): qty 1
  Filled 2013-08-07 (×3): qty 2
  Filled 2013-08-07: qty 1
  Filled 2013-08-07: qty 2
  Filled 2013-08-07: qty 1
  Filled 2013-08-07 (×3): qty 2

## 2013-08-07 MED ORDER — DIPHENHYDRAMINE HCL 25 MG PO CAPS: 25.0000 mg | ORAL_CAPSULE | Freq: Four times a day (QID) | ORAL | Status: DC | PRN

## 2013-08-07 MED ORDER — CELECOXIB 200 MG PO CAPS
200.0000 mg | ORAL_CAPSULE | Freq: Two times a day (BID) | ORAL | Status: DC
  Administered 2013-08-07 – 2013-08-09 (×3): 200 mg via ORAL
  Filled 2013-08-07 (×5): qty 1

## 2013-08-07 MED ORDER — ASPIRIN EC 325 MG PO TBEC
325.0000 mg | DELAYED_RELEASE_TABLET | Freq: Two times a day (BID) | ORAL | Status: DC
  Administered 2013-08-08 – 2013-08-09 (×3): 325 mg via ORAL
  Filled 2013-08-07 (×5): qty 1

## 2013-08-07 MED ORDER — ONDANSETRON HCL 4 MG PO TABS: 4.0000 mg | ORAL_TABLET | Freq: Four times a day (QID) | ORAL | Status: DC | PRN

## 2013-08-07 MED ORDER — SODIUM CHLORIDE 0.9 % IJ SOLN
INTRAMUSCULAR | Status: DC | PRN
  Administered 2013-08-07: 14 mL via INTRAVENOUS

## 2013-08-07 MED ORDER — BUPIVACAINE LIPOSOME 1.3 % IJ SUSP
INTRAMUSCULAR | Status: DC | PRN
  Administered 2013-08-07: 20 mL

## 2013-08-07 MED ORDER — BUPIVACAINE-EPINEPHRINE (PF) 0.25% -1:200000 IJ SOLN
INTRAMUSCULAR | Status: DC | PRN
  Administered 2013-08-07: 25 mL

## 2013-08-07 MED ORDER — ONDANSETRON HCL 4 MG/2ML IJ SOLN
INTRAMUSCULAR | Status: DC | PRN
  Administered 2013-08-07: 4 mg via INTRAVENOUS

## 2013-08-07 MED ORDER — METHOCARBAMOL 100 MG/ML IJ SOLN
500.0000 mg | Freq: Four times a day (QID) | INTRAVENOUS | Status: DC | PRN
  Administered 2013-08-07: 500 mg via INTRAVENOUS
  Filled 2013-08-07: qty 5

## 2013-08-07 MED ORDER — BUPIVACAINE LIPOSOME 1.3 % IJ SUSP
20.0000 mL | Freq: Once | INTRAMUSCULAR | Status: DC
  Filled 2013-08-07: qty 20

## 2013-08-07 MED ORDER — MIDAZOLAM HCL 5 MG/5ML IJ SOLN
INTRAMUSCULAR | Status: DC | PRN
  Administered 2013-08-07 (×4): 1 mg via INTRAVENOUS

## 2013-08-07 MED ORDER — PHENYLEPHRINE 40 MCG/ML (10ML) SYRINGE FOR IV PUSH (FOR BLOOD PRESSURE SUPPORT)
PREFILLED_SYRINGE | INTRAVENOUS | Status: AC
  Filled 2013-08-07: qty 10

## 2013-08-07 MED ORDER — BUPIVACAINE HCL (PF) 0.5 % IJ SOLN
INTRAMUSCULAR | Status: AC
  Filled 2013-08-07: qty 30

## 2013-08-07 MED ORDER — ONDANSETRON HCL 4 MG/2ML IJ SOLN
4.0000 mg | Freq: Four times a day (QID) | INTRAMUSCULAR | Status: DC | PRN
  Administered 2013-08-07 – 2013-08-08 (×3): 4 mg via INTRAVENOUS
  Filled 2013-08-07 (×3): qty 2

## 2013-08-07 MED ORDER — FLEET ENEMA 7-19 GM/118ML RE ENEM: 1.0000 | ENEMA | Freq: Once | RECTAL | Status: AC | PRN

## 2013-08-07 MED ORDER — STERILE WATER FOR IRRIGATION IR SOLN
Status: DC | PRN
  Administered 2013-08-07: 1500 mL

## 2013-08-07 MED ORDER — FENTANYL CITRATE 0.05 MG/ML IJ SOLN
INTRAMUSCULAR | Status: DC | PRN
  Administered 2013-08-07: 25 ug via INTRAVENOUS
  Administered 2013-08-07: 100 ug via INTRAVENOUS
  Administered 2013-08-07: 25 ug via INTRAVENOUS
  Administered 2013-08-07: 20 ug via INTRAVENOUS
  Administered 2013-08-07: 25 ug via INTRAVENOUS

## 2013-08-07 MED ORDER — POLYETHYLENE GLYCOL 3350 17 G PO PACK
17.0000 g | PACK | Freq: Two times a day (BID) | ORAL | Status: DC
  Administered 2013-08-08 – 2013-08-09 (×2): 17 g via ORAL

## 2013-08-07 MED ORDER — LACTATED RINGERS IV SOLN
INTRAVENOUS | Status: DC
  Administered 2013-08-07: 1000 mL via INTRAVENOUS
  Administered 2013-08-07 (×2): via INTRAVENOUS

## 2013-08-07 MED ORDER — METOCLOPRAMIDE HCL 5 MG/ML IJ SOLN: 5.0000 mg | Freq: Three times a day (TID) | INTRAMUSCULAR | Status: DC | PRN

## 2013-08-07 MED ORDER — BUPIVACAINE-EPINEPHRINE PF 0.25-1:200000 % IJ SOLN
INTRAMUSCULAR | Status: AC
  Filled 2013-08-07: qty 30

## 2013-08-07 MED ORDER — SODIUM CHLORIDE 0.9 % IJ SOLN
INTRAMUSCULAR | Status: AC
  Filled 2013-08-07: qty 50

## 2013-08-07 MED ORDER — PHENOL 1.4 % MT LIQD: 1.0000 | OROMUCOSAL | Status: DC | PRN

## 2013-08-07 SURGICAL SUPPLY — 69 items
ADH SKN CLS APL DERMABOND .7 (GAUZE/BANDAGES/DRESSINGS) ×1
BAG SPEC THK2 15X12 ZIP CLS (MISCELLANEOUS) ×1
BAG ZIPLOCK 12X15 (MISCELLANEOUS) ×2 IMPLANT
BANDAGE ELASTIC 6 VELCRO ST LF (GAUZE/BANDAGES/DRESSINGS) ×2 IMPLANT
BANDAGE ESMARK 6X9 LF (GAUZE/BANDAGES/DRESSINGS) ×1 IMPLANT
BLADE SAW SGTL 13.0X1.19X90.0M (BLADE) ×2 IMPLANT
BNDG CMPR 9X6 STRL LF SNTH (GAUZE/BANDAGES/DRESSINGS) ×1
BNDG ESMARK 6X9 LF (GAUZE/BANDAGES/DRESSINGS) ×2
BONE CEMENT GENTAMICIN (Cement) ×4 IMPLANT
BOWL SMART MIX CTS (DISPOSABLE) ×2 IMPLANT
CAP UPCHARGE REVISION TRAY ×1 IMPLANT
CAPT RP KNEE ×1 IMPLANT
CEMENT BONE GENTAMICIN 40 (Cement) IMPLANT
CUFF TOURN SGL QUICK 34 (TOURNIQUET CUFF) ×2
CUFF TRNQT CYL 34X4X40X1 (TOURNIQUET CUFF) ×1 IMPLANT
DECANTER SPIKE VIAL GLASS SM (MISCELLANEOUS) ×2 IMPLANT
DERMABOND ADVANCED (GAUZE/BANDAGES/DRESSINGS) ×1
DERMABOND ADVANCED .7 DNX12 (GAUZE/BANDAGES/DRESSINGS) ×1 IMPLANT
DRAPE EXTREMITY T 121X128X90 (DRAPE) ×2 IMPLANT
DRAPE POUCH INSTRU U-SHP 10X18 (DRAPES) ×2 IMPLANT
DRAPE U-SHAPE 47X51 STRL (DRAPES) ×2 IMPLANT
DRSG AQUACEL AG ADV 3.5X10 (GAUZE/BANDAGES/DRESSINGS) ×2 IMPLANT
DRSG AQUACEL AG ADV 3.5X14 (GAUZE/BANDAGES/DRESSINGS) ×1 IMPLANT
DRSG TEGADERM 4X4.75 (GAUZE/BANDAGES/DRESSINGS) IMPLANT
DURAPREP 26ML APPLICATOR (WOUND CARE) ×4 IMPLANT
ELECT REM PT RETURN 9FT ADLT (ELECTROSURGICAL) ×2
ELECTRODE REM PT RTRN 9FT ADLT (ELECTROSURGICAL) ×1 IMPLANT
EVACUATOR 1/8 PVC DRAIN (DRAIN) IMPLANT
FACESHIELD LNG OPTICON STERILE (SAFETY) ×10 IMPLANT
FEM TC3 PFC SZ3 LEFT (Orthopedic Implant) ×2 IMPLANT
FEMORAL TC3 PFC SZ3 LEFT (Orthopedic Implant) IMPLANT
GAUZE SPONGE 2X2 8PLY STRL LF (GAUZE/BANDAGES/DRESSINGS) IMPLANT
GLOVE BIOGEL PI IND STRL 7.5 (GLOVE) ×1 IMPLANT
GLOVE BIOGEL PI IND STRL 8 (GLOVE) ×1 IMPLANT
GLOVE BIOGEL PI INDICATOR 7.5 (GLOVE) ×1
GLOVE BIOGEL PI INDICATOR 8 (GLOVE) ×1
GLOVE ECLIPSE 8.0 STRL XLNG CF (GLOVE) ×2 IMPLANT
GLOVE ORTHO TXT STRL SZ7.5 (GLOVE) ×4 IMPLANT
GOWN BRE IMP PREV XXLGXLNG (GOWN DISPOSABLE) ×2 IMPLANT
GOWN PREVENTION PLUS LG XLONG (DISPOSABLE) ×2 IMPLANT
HANDPIECE INTERPULSE COAX TIP (DISPOSABLE) ×2
INSERT TC3 TIBIAL 3.0 (Knees) ×1 IMPLANT
KIT BASIN OR (CUSTOM PROCEDURE TRAY) ×2 IMPLANT
MANIFOLD NEPTUNE II (INSTRUMENTS) ×2 IMPLANT
NDL SAFETY ECLIPSE 18X1.5 (NEEDLE) ×1 IMPLANT
NEEDLE HYPO 18GX1.5 SHARP (NEEDLE) ×2
NS IRRIG 1000ML POUR BTL (IV SOLUTION) ×2 IMPLANT
PACK TOTAL JOINT (CUSTOM PROCEDURE TRAY) ×2 IMPLANT
POSITIONER SURGICAL ARM (MISCELLANEOUS) ×2 IMPLANT
RESTRICTOR CEMENT SZ 5 C-STEM (Cement) ×1 IMPLANT
SET HNDPC FAN SPRY TIP SCT (DISPOSABLE) ×1 IMPLANT
SET PAD KNEE POSITIONER (MISCELLANEOUS) ×2 IMPLANT
SPONGE GAUZE 2X2 STER 10/PKG (GAUZE/BANDAGES/DRESSINGS)
STEM TIBIA PFC 13X30MM (Stem) ×1 IMPLANT
SUCTION FRAZIER 12FR DISP (SUCTIONS) ×2 IMPLANT
SUT MNCRL AB 3-0 PS2 18 (SUTURE) ×1 IMPLANT
SUT MNCRL AB 4-0 PS2 18 (SUTURE) ×2 IMPLANT
SUT VIC AB 1 CT1 27 (SUTURE) ×2
SUT VIC AB 1 CT1 27XBRD ANTBC (SUTURE) IMPLANT
SUT VIC AB 1 CT1 36 (SUTURE) ×2 IMPLANT
SUT VIC AB 2-0 CT1 27 (SUTURE) ×6
SUT VIC AB 2-0 CT1 TAPERPNT 27 (SUTURE) ×3 IMPLANT
SUT VLOC 180 0 24IN GS25 (SUTURE) ×2 IMPLANT
SYR 50ML LL SCALE MARK (SYRINGE) ×2 IMPLANT
TOWEL OR 17X26 10 PK STRL BLUE (TOWEL DISPOSABLE) ×4 IMPLANT
TRAY FOLEY CATH 14FRSI W/METER (CATHETERS) IMPLANT
TRAY SLEEVE CEM ML (Knees) ×1 IMPLANT
WATER STERILE IRR 1500ML POUR (IV SOLUTION) ×2 IMPLANT
WRAP KNEE MAXI GEL POST OP (GAUZE/BANDAGES/DRESSINGS) ×2 IMPLANT

## 2013-08-07 NOTE — Brief Op Note (Signed)
08/07/2013  10:05 AM  PATIENT:  Chelsea Thornton  62 y.o. 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  PROCEDURE:  Left total knee arthroplasty Components used - Depuy TC-3 femu and MBT revision tray  SURGEON:  Surgeon(s) and Role:    * [name], MD - Primary  PHYSICIAN ASSISTANT: Lanney Gins, PA-C  ANESTHESIA:   spinal  EBL:  Total I/O In: 2000 [I.V.:2000] Out: 355 [Urine:280; Blood:75]  BLOOD ADMINISTERED:none  DRAINS: none   LOCAL MEDICATIONS USED:  OTHER Exparel and Marcaine  SPECIMEN:  No Specimen  DISPOSITION OF SPECIMEN:  N/A  COUNTS:  YES  TOURNIQUET:   Total Tourniquet Time Documented: Thigh (Left) - 81 minutes Total: Thigh (Left) - 81 minutes   DICTATION: .Other Dictation: Dictation Number [id number]  PLAN OF CARE: Admit to inpatient   PATIENT DISPOSITION:  PACU - hemodynamically stable.   Delay start of Pharmacological VTE agent (>24hrs) due to surgical blood loss or risk of bleeding: no

## 2013-08-07 NOTE — Anesthesia Procedure Notes (Signed)
Spinal  Patient location during procedure: OR Start time: 08/07/2013 7:34 AM End time: 08/07/2013 7:39 AM Staffing Anesthesiologist: Lewie Loron R Performed by: anesthesiologist  Preanesthetic Checklist Completed: patient identified, site marked, surgical consent, pre-op evaluation, timeout performed, IV checked, risks and benefits discussed and monitors and equipment checked Spinal Block Patient position: sitting Prep: Betadine Patient monitoring: heart rate, continuous pulse ox and blood pressure Approach: right paramedian Location: L2-3 Injection technique: single-shot Needle Needle type: Quincke  Needle gauge: 22 G Needle length: 9 cm Additional Notes Expiration date of kit checked and confirmed. Patient tolerated procedure well, without complications.

## 2013-08-07 NOTE — Progress Notes (Signed)
Utilization review completed.  

## 2013-08-07 NOTE — Anesthesia Postprocedure Evaluation (Signed)
Anesthesia Post Note  Patient: Chelsea Thornton  Procedure(s) Performed: Procedure(s) (LRB): LEFT TOTAL KNEE ARTHROPLASTY WITH REVISION COMPONENT  (Left)  Anesthesia type: Spinal  Patient location: PACU  Post pain: Pain level controlled  Post assessment: Post-op Vital signs reviewed  Last Vitals: BP 108/64  Pulse 95  Temp(Src) 37.4 C (Oral)  Resp 18  Ht 5\' 7"  (1.702 m)  Wt 194 lb (87.998 kg)  BMI 30.38 kg/m2  SpO2 95%  Post vital signs: Reviewed  Level of consciousness: sedated  Complications: No apparent anesthesia complications

## 2013-08-07 NOTE — Progress Notes (Signed)
CSW consulted for SNF placement. PN reviewed. PT is recommending HHPT following hospital d/c. RNCM will assist with d/c planning needs.  Jaye Polidori LCSW [phone] 

## 2013-08-07 NOTE — Evaluation (Signed)
Physical Therapy Evaluation Patient Details Name: Chelsea Thornton MRN: [id number] DOB: [birth year] Today's Date: 08/07/2013 Time: 5621-3086 PT Time Calculation (min): 25 min  PT Assessment / Plan / Recommendation History of Present Illness  Chelsea Thornton, 62 y.o. female, has a history of pain and functional disability in the left knee due to trauma, tibial plateau fracture in January 2014, and has failed non-surgical conservative treatments; s/p L TKA  Clinical Impression  Pt will benefit form PT to address deficits below    PT Assessment  Patient needs continued PT services    Follow Up Recommendations  Home health PT    Does the patient have the potential to tolerate intense rehabilitation      Barriers to Discharge        Equipment Recommendations  None recommended by PT    Recommendations for Other Services     Frequency 7X/week    Precautions / Restrictions Precautions Precautions: Knee Restrictions Weight Bearing Restrictions: No Other Position/Activity Restrictions: WBAT   Pertinent Vitals/Pain HR 103      Mobility  Bed Mobility Bed Mobility: Supine to Sit Supine to Sit: 4: Min assist Details for Bed Mobility Assistance: cues for technique Transfers Transfers: Sit to Stand;Stand to Sit Sit to Stand: 4: Min assist Stand to Sit: 4: Min assist Details for Transfer Assistance: cues for hand placement  Ambulation/Gait Ambulation/Gait Assistance: 4: Min assist Ambulation Distance (Feet): 18 Feet Assistive device: Rolling walker Ambulation/Gait Assistance Details: cues for sequence Gait Pattern: Step-to pattern Gait velocity: decr    Exercises Total Joint Exercises Ankle Circles/Pumps: AROM;5 reps;Both Quad Sets: AROM;5 reps;Both   PT Diagnosis: Difficulty walking  PT Problem List: Decreased strength;Decreased range of motion;Decreased activity tolerance;Decreased balance;Decreased mobility;Decreased knowledge of use of DME;Decreased knowledge of  precautions PT Treatment Interventions: DME instruction;Gait training;Functional mobility training;Therapeutic activities;Therapeutic exercise;Patient/family education     PT Goals(Current goals can be found in the care plan section) Acute Rehab PT Goals Patient Stated Goal:  get back to walking PT Goal Formulation: With patient Time For Goal Achievement: 08/10/13 Potential to Achieve Goals: Good  Visit Information  Last PT Received On: 08/07/13 Assistance Needed: +1 History of Present Illness: Chelsea Thornton, 62 y.o. female, has a history of pain and functional disability in the left knee due to trauma, tibial plateau fracture in January 2014, and has failed non-surgical conservative treatments; s/p L TKA       Prior Functioning  Home Living Family/patient expects to be discharged to:: Private residence Living Arrangements: Alone Type of Home: House Home Access: Level entry Home Layout: One level Home Equipment: Environmental consultant - 2 wheels;Cane - single point;Bedside commode Prior Function Level of Independence: Independent with assistive device(s) Comments: cane in house, walker outside Communication Communication: No difficulties    Cognition  Cognition Arousal/Alertness: Awake/alert Behavior During Therapy: WFL for tasks assessed/performed Overall Cognitive Status: Within Functional Limits for tasks assessed    Extremity/Trunk Assessment Upper Extremity Assessment Upper Extremity Assessment: Defer to OT evaluation Lower Extremity Assessment Lower Extremity Assessment: LLE deficits/detail LLE Deficits / Details: ankle WFL,  quads 3/5, unable to do SLR without assist   Balance    End of Session PT - End of Session Equipment Utilized During Treatment: Gait belt Activity Tolerance: Patient tolerated treatment well Patient left: in chair;with call bell/phone within reach;with family/visitor present Nurse Communication: Mobility status  GP     [facility] [address] 08/07/2013, 2:10  PM

## 2013-08-07 NOTE — Interval H&P Note (Signed)
History and Physical Interval Note:  08/07/2013 7:12 AM  Chelsea Thornton  has presented today for surgery, with the diagnosis of LEFT KNEE POST TRAMATIC OA   The various methods of treatment have been discussed with the patient and family. After consideration of risks, benefits and other options for treatment, the patient has consented to  Procedure(s): LEFT TOTAL KNEE ARTHROPLASTY WITH REVISION COMPONENT  (Left) as a surgical intervention .  The patient's history has been reviewed, patient examined, no change in status, stable for surgery.  I have reviewed the patient's chart and labs.  Questions were answered to the patient's satisfaction.     [name]

## 2013-08-07 NOTE — Transfer of Care (Signed)
Immediate Anesthesia Transfer of Care Note  Patient: Chelsea Thornton  Procedure(s) Performed: Procedure(s): LEFT TOTAL KNEE ARTHROPLASTY WITH REVISION COMPONENT  (Left)  Patient Location: PACU  Anesthesia Type:Spinal  Level of Consciousness: awake, alert , oriented and patient cooperative  Airway & Oxygen Therapy: Patient Spontanous Breathing and Patient connected to face mask oxygen  Post-op Assessment: Report given to PACU RN and Post -op Vital signs reviewed and stable  Post vital signs: Reviewed and stable  Complications: No apparent anesthesia complications

## 2013-08-08 ENCOUNTER — Encounter (HOSPITAL_COMMUNITY): Payer: Self-pay | Admitting: Orthopedic Surgery

## 2013-08-08 LAB — GLUCOSE, CAPILLARY
Glucose-Capillary: 231 mg/dL — ABNORMAL HIGH (ref 70–99)
Glucose-Capillary: 200 mg/dL — ABNORMAL HIGH (ref 70–99)
Glucose-Capillary: 186 mg/dL — ABNORMAL HIGH (ref 70–99)
Glucose-Capillary: 171 mg/dL — ABNORMAL HIGH (ref 70–99)

## 2013-08-08 LAB — CBC
MCH: 28.6 pg (ref 26.0–34.0)
Platelets: 251 10*3/uL (ref 150–400)
RBC: 3.01 MIL/uL — ABNORMAL LOW (ref 3.87–5.11)
RDW: 13.2 % (ref 11.5–15.5)
WBC: 8.5 10*3/uL (ref 4.0–10.5)

## 2013-08-08 LAB — BASIC METABOLIC PANEL
CO2: 24 mEq/L (ref 19–32)
Calcium: 7.7 mg/dL — ABNORMAL LOW (ref 8.4–10.5)
Chloride: 102 mEq/L (ref 96–112)
GFR calc Af Amer: >90 mL/min (ref >90)
GFR calc non Af Amer: >90 mL/min (ref >90)
Potassium: 4.1 mEq/L (ref 3.7–5.3)
Sodium: 136 mEq/L — ABNORMAL LOW (ref 137–147)

## 2013-08-08 MED ORDER — ASPIRIN 325 MG PO TBEC: 325.0000 mg | DELAYED_RELEASE_TABLET | Freq: Two times a day (BID) | ORAL | Status: AC

## 2013-08-08 MED ORDER — POLYETHYLENE GLYCOL 3350 17 G PO PACK: 17.0000 g | PACK | Freq: Two times a day (BID) | ORAL | Status: DC

## 2013-08-08 MED ORDER — SODIUM CHLORIDE 0.9 % IV BOLUS (SEPSIS)
500.0000 mL | Freq: Once | INTRAVENOUS | Status: AC
  Administered 2013-08-08: 500 mL via INTRAVENOUS

## 2013-08-08 MED ORDER — HYDROCODONE-ACETAMINOPHEN 7.5-325 MG PO TABS: 1.0000 | ORAL_TABLET | ORAL | Status: DC | PRN

## 2013-08-08 MED ORDER — METHOCARBAMOL 500 MG PO TABS: 500.0000 mg | ORAL_TABLET | Freq: Four times a day (QID) | ORAL | Status: DC | PRN

## 2013-08-08 MED ORDER — DSS 100 MG PO CAPS: 100.0000 mg | ORAL_CAPSULE | Freq: Two times a day (BID) | ORAL | Status: DC

## 2013-08-08 NOTE — Progress Notes (Signed)
Physical Therapy Treatment Patient Details Name: Chelsea Thornton MRN: [id number] DOB: [birth year] Today's Date: 08/08/2013 Time: 7829-5621 PT Time Calculation (min): 25 min  PT Assessment / Plan / Recommendation  History of Present Illness Chelsea Thornton, 62 y.o. female, has a history of pain and functional disability in the left knee due to trauma, tibial plateau fracture in January 2014, and has failed non-surgical conservative treatments; s/p L TKA   PT Comments   POD # 1 pm session.  Pt not progressing enough to safely D/C to home today.  Still c/o feeling "woozy".  Amb from recliner to BR to attempt void (unsuccessful) then amb back to bed. BP standing this session was 90/55.   Follow Up Recommendations  Home health PT     Does the patient have the potential to tolerate intense rehabilitation     Barriers to Discharge        Equipment Recommendations  None recommended by PT    Recommendations for Other Services    Frequency 7X/week   Progress towards PT Goals Progress towards PT goals: Progressing toward goals  Plan      Precautions / Restrictions Precautions Precautions: Knee Restrictions Weight Bearing Restrictions: No Other Position/Activity Restrictions: WBAT    Pertinent Vitals/Pain C/o "very little" knee pain but feeling "poorly"    Mobility  Bed Mobility Bed Mobility: Sit to Supine Supine to Sit: 4: Min assist Sit to Supine: 4: Min assist Details for Bed Mobility Assistance: min assist to support L LE up onto bed and increased time Transfers Transfers: Sit to Stand;Stand to Sit Sit to Stand: 4: Min assist;4: Min guard;From toilet;From chair/3-in-1 Stand to Sit: 4: Min assist;4: Min guard;To toilet;To bed Details for Transfer Assistance: cues for hand placement and increased time Ambulation/Gait Ambulation/Gait Assistance: 4: Min assist;4: Min guard Ambulation Distance (Feet): 22 Feet Assistive device:  (11 feet to and from BR then back to  bed) Ambulation/Gait Assistance Details: increased time and mild c/o feeling "woozy" BP this time standing was 90/55. Amb back to bed.  Gait Pattern: Step-to pattern Gait velocity: decr     PT Goals (current goals can now be found in the care plan section) Acute Rehab PT Goals Patient Stated Goal:  get back to walking  Visit Information  Last PT Received On: 08/08/13 Assistance Needed: +1 History of Present Illness: Chelsea Thornton, 62 y.o. female, has a history of pain and functional disability in the left knee due to trauma, tibial plateau fracture in January 2014, and has failed non-surgical conservative treatments; s/p L TKA    Subjective Data  Patient Stated Goal:  get back to walking   Cognition  Cognition Arousal/Alertness: Awake/alert Behavior During Therapy: WFL for tasks assessed/performed Overall Cognitive Status: Within Functional Limits for tasks assessed    Balance     End of Session PT - End of Session Equipment Utilized During Treatment: Gait belt Activity Tolerance: Other (comment) (limited hypotension) Patient left: in bed;with call bell/phone within reach   Felecia Shelling  PTA [facility]  Acute  Rehab Pager      [phone]

## 2013-08-08 NOTE — Op Note (Signed)
NAMECLAUDELL, RHODY             ACCOUNT NO.:  [id number]  MEDICAL RECORD NO.:  [id number]  LOCATION:  1604                         FACILITY:  [facility]  PHYSICIAN:  Madlyn Frankel. Charlann Boxer, M.D.  DATE OF BIRTH:  May 25, 1951  DATE OF PROCEDURE:  08/07/2013 DATE OF DISCHARGE:                              OPERATIVE REPORT   PREOPERATIVE DIAGNOSIS:  Posttraumatic arthritis in the left knee with significant deformity.  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  PROCEDURE:  Conversion of previous failed left knee surgery to a left total knee arthroplasty utilizing DePuy component TC3 femur, size 3 MBT revision tray with a 29 cemented sleeve, 13 x 30 cemented stem, and a 12.5 mm TC3 insert with a 35 patellar button.  SURGEON:  Madlyn Frankel. Charlann Boxer, M.D.  ASSISTANT:  Lanney Gins, PA-C.  Note that Mr. Carmon Sails was present for the entirety of the case from preoperative positioning, perioperative management of the upper extremity, general facilitation of the case, and primary wound closure.  ANESTHESIA:  Spinal.  SPECIMENS:  None.  COMPLICATIONS:  None.  DRAINS:  None.  TOURNIQUET TIME:  81 minutes at 250 mmHg.  BLOOD LOSS:  Less than 100 mL.  INDICATIONS FOR PROCEDURE:  Ms. Cassaday is a 62 year old female with a history of medial base tibial plateau fracture fixed by one of my partners.  She had progressive varus deformity of her leg with weightbearing, increasing pain, and dysfunction.  She had been seen and evaluated a few months ago and scheduled for a two-stage procedure with the 1st stage being removal of her deep implants through her lateral- based incision prior to total knee arthroplasty.  She successfully underwent this procedure approximately 6 weeks ago.  Her wound had healed and I felt it was safer to proceed at this point with total knee arthroplasty.  Risks and benefits were  reviewed including risk of infection, DVT, component failure, need for future revision surgery, particularly given the complexity of this case.  Consent was obtained for benefit of pain relief and improved functional capabilities.  PROCEDURE IN DETAIL:  The patient was brought to the operative theater. Once adequate anesthesia, preoperative antibiotics, Ancef administered. She was positioned supine with a left thigh tourniquet placed.  Left lower extremity was prescrubbed and prepped and draped in usual sterile fashion with the left foot placed in DeMayo leg holder.  Time-out was performed identifying the patient, planned procedure, and extremity. Leg was then exsanguinated, tourniquet elevated to 250 mmHg.  A paramedial midline incision was made at this point separating from the lateral incision by at least 4 cm at the distal extent.  Soft tissue dissection was carried down evacuating old hematoma from the lateral based incision.  No signs of infection.  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  Soft tissue dissection of the tibial plateau was carried out to allow for exposure of the medial aspect of the knee.  Following exposure including the medial and lateral aspects of the knee as well as suprapatellar region, the patella was laterally subluxated and attention  first directed to the femur.  The femoral canal was opened with a drill, irrigated to try to prevent fat emboli.  An intramedullary rod was then passed at 5 degrees of valgus, 9 mm of bone resected off the distal femur.  The patient had severe degenerative changes in the medial compartment of the knee as well as in the patellofemoral region.  Once the distal femoral cut was made, the tibia was subluxated anteriorly with retractors placed.  Using extramedullary guide, I resected 10 mm off the proximal lateral  tibia, which was relatively intact.  Following this resection, I was able to further evaluate the extent of the bone deformity medially, approximately 1.5 cm of bone off the medial aspect of the proximal tibia was removed.  After this bone cut back to stable bone removing all fibers of the nonviable bone fragments.  This left basically greater than two-thirds of the cortical shell, the proximal to be intact with the defect at the fracture site.  I confirmed that the extension gap would be stable with a 10 mm block and it was.  We then confirmed the cut was perpendicular in the coronal plane.  At this point, I sized the femur to be a size 3.  The size 3 rotation block was then pinned into position using the C-clamp based off the proximal tibial cut.  The size 3, 4:1 cutting block was pinned into position.  Anterior, posterior, and chamfer cuts were then made without difficulty.  At this point, I elected to finish up on the tibial side. I elected to use a size 3 tibial tray, which seemed to fit nicely, particularly on the lateral and the remaining bone stock on the medial and posterior aspects of the medial tibia.  It was pinned into position, drilled, and then I selected to use based on the defect medially to use a cemented sleeve to help with rotational stability.  At this point, I placed a trial MBT size 3 tibial tray with a 29 cemented sleeve and finished up the femoral side by creating a box cut for TC3 component.  Trial reduction was now carried out.  I did not find that the knee came to full extension and good flexion with intact ligaments medially and laterally despite the extensive and debridement of the bone medially as well as the medial capsular dissection for exposure.  At this point, I attended to the patella, precut measure was noted to be about 23 mm.  I resected down to 14 mm, used a 35 patellar button to restore height.  Lug holes were drilled.  The patella was  noted to track through the trochlea without application of pressure.  At this point, the trial femoral component was removed.  With the tibial tray in place, I removed excessive posterior medial bone osteophytes. Re-shaping the proximal tibia back to the normal anatomic appearance.  The tibial component was removed.  I placed a cement restrictor deep into the proximal tibia and then the knee was irrigated with 1 L of normal saline solution pulse lavage.  The final components were opened on the back table and configured under my direct supervision including 13 x 30 cemented stem, and the 29 cemented sleeve on the tibial component.  Final components were then cemented into position.  The knee was brought to extension with a 12.5 posterior stabilized insert until the cement fully cured.  I re-examined the knee, I was very happy with the ligament stability, still elected to use a  TC3 component based on the potential for complications related to the proximal and medial defect of the tibia.  The final 12.5 TC3 tibial insert was then selected, opened, and then placed into the proximal tibia.  The knee was re-irrigated with normal saline solution pulse lavage.  No Hemovac drain was used.  The extensor mechanism was reapproximated using a #1 Vicryl in an interrupted fashion in addition to the 0 V-Loc suture.  The remainder of the wound was closed with 3-0 Vicryl and running 3-0 Monocryl.  The knee was then cleaned, dried, and dressed sterilely using Dermabond and an Aquacel dressing.  The knee was wrapped in Ace wrap. She was then brought to the recovery room in stable condition tolerating the procedure well.  Physical Therapy is seeing her today for early mobilization and for discharge in the next 1-2 days.     Madlyn Frankel Charlann Boxer, M.D.     MDO/MEDQ  D:  08/07/2013  T:  08/08/2013  Job:  [id number]

## 2013-08-08 NOTE — Progress Notes (Signed)
   Subjective: 1 Day Post-Op Procedure(s) (LRB): LEFT TOTAL KNEE ARTHROPLASTY WITH REVISION COMPONENT  (Left)   Patient reports pain as mild, pain controlled. No events throughout the night. States that she feels like she is walking on a new knee.  Ready to be discharged home after PT.  Objective:   VITALS:   Filed Vitals:   08/08/13  BP: 101/61  Pulse: 77  Temp: 97.7 F (36.5 C)   Resp: 18    Neurovascular intact Dorsiflexion/Plantar flexion intact Incision: dressing C/D/I No cellulitis present Compartment soft  LABS  Recent Labs  08/08/13 0446  HGB 8.6*  HCT 25.8*  WBC 8.5  PLT 251     Recent Labs  08/08/13 0446  NA 136*  K 4.1  BUN 20  CREATININE 0.43*  GLUCOSE 221*     Assessment/Plan: 1 Day Post-Op Procedure(s) (LRB): LEFT TOTAL KNEE ARTHROPLASTY WITH REVISION COMPONENT  (Left) Advance diet Up with therapy D/C IV fluids Discharge home with home health Follow [facility] in 2 weeks at [facility]. Follow up with OLIN,Keirah Konitzer D in 2 weeks.  Contact information:  [facility] [address] [place] [address] [phone]    Expected ABLA  Treated with iron and will observe  Obese (BMI 30-39.9) Estimated body mass index is 30.38 kg/(m^2) as calculated from the following:   Height as of this encounter: 5\' 7"  (1.702 m).   Weight as of this encounter: 87.998 kg (194 lb). Patient also counseled that weight may inhibit the healing process Patient counseled that losing weight will help with future health issues        Anastasio Auerbach. Laval Cafaro   PAC  08/08/2013, 7:31 AM

## 2013-08-08 NOTE — Progress Notes (Signed)
Physical Therapy Treatment Patient Details Name: Chelsea Thornton MRN: [id number] DOB: [birth year] Today's Date: 08/08/2013 Time: 0910-0940 PT Time Calculation (min): 30 min  PT Assessment / Plan / Recommendation  History of Present Illness Chelsea Thornton, 62 y.o. female, has a history of pain and functional disability in the left knee due to trauma, tibial plateau fracture in January 2014, and has failed non-surgical conservative treatments; s/p L TKA   PT Comments   POD # 1 am session.  Assisted pt OOB to amb to BR to attempt void (unsuccessful) then amb in hallway when she c/o MAX "swimmy headed".  BP standing was 80/55.  Pt returned to recliner and reported to RN.   Follow Up Recommendations  Home health PT     Does the patient have the potential to tolerate intense rehabilitation     Barriers to Discharge        Equipment Recommendations  None recommended by PT    Recommendations for Other Services    Frequency 7X/week   Progress towards PT Goals Progress towards PT goals: Progressing toward goals  Plan      Precautions / Restrictions Precautions Precautions: Knee Restrictions Weight Bearing Restrictions: No Other Position/Activity Restrictions: WBAT    Pertinent Vitals/Pain C/o "very little" knee pain    Mobility  Bed Mobility Bed Mobility: Supine to Sit Supine to Sit: 4: Min assist Details for Bed Mobility Assistance: cues for technique and increased time due to c/o nausea Transfers Transfers: Sit to Stand;Stand to Sit Sit to Stand: 4: Min assist;4: Min guard;From bed;From toilet Stand to Sit: 4: Min assist;4: Min guard;To toilet;To chair/3-in-1 Details for Transfer Assistance: cues for hand placement and increased time Ambulation/Gait Ambulation/Gait Assistance: 4: Min assist;4: Min guard Ambulation Distance (Feet): 30 Feet Assistive device: Rolling walker Ambulation/Gait Assistance Details: 25% VC's on proper sequencing and safety with turns.  MAX c/o  feeling "swimmy headed" so while standing took BP was 80/55.  Reported to RN and pt returned to recliner.   Gait Pattern: Step-to pattern Gait velocity: decr     PT Goals (current goals can now be found in the care plan section) Acute Rehab PT Goals Patient Stated Goal:  get back to walking  Visit Information  Last PT Received On: 08/08/13 Assistance Needed: +1 History of Present Illness: Chelsea Thornton, 62 y.o. female, has a history of pain and functional disability in the left knee due to trauma, tibial plateau fracture in January 2014, and has failed non-surgical conservative treatments; s/p L TKA    Subjective Data  Patient Stated Goal:  get back to walking   Cognition  Cognition Arousal/Alertness: Awake/alert Behavior During Therapy: WFL for tasks assessed/performed Overall Cognitive Status: Within Functional Limits for tasks assessed    Balance     End of Session PT - End of Session Equipment Utilized During Treatment: Gait belt Activity Tolerance: Other (comment) (limited hypotension) Patient left: in chair;with call bell/phone within reach;with family/visitor present   Chelsea Thornton  PTA [facility]  Acute  Rehab Pager      [phone]

## 2013-08-08 NOTE — Evaluation (Signed)
Occupational Therapy Evaluation Patient Details Name: Chelsea Thornton MRN: [id number] DOB: 11-17-1950 Today's Date: 08/08/2013 Time: 4098-1191 OT Time Calculation (min): 15 min  OT Assessment / Plan / Recommendation History of present illness Chelsea Thornton, 62 y.o. female, has a history of pain and functional disability in the left knee due to trauma, tibial plateau fracture in January 2014, and has failed non-surgical conservative treatments; s/p L TKA   Clinical Impression   Pt was admitted for the above surgery.  She was mod I with adls at home, but had pain.  She is currently min to mod A for LB adls and was limited by dizziness and nausea.  Goals in acute are for supervision level.    OT Assessment  Patient needs continued OT Services    Follow Up Recommendations  No OT follow up;Supervision/Assistance - 24 hour    Barriers to Discharge      Equipment Recommendations  None recommended by OT    Recommendations for Other Services    Frequency  Min 2X/week    Precautions / Restrictions Precautions Precautions: Knee Restrictions Weight Bearing Restrictions: No   Pertinent Vitals/Pain No pain reported. Pt with dizziness and nausea.  BP sitting 127/66    ADL  Lower Body Bathing: Minimal assistance Where Assessed - Lower Body Bathing: Supported sit to stand Lower Body Dressing: Moderate assistance Where Assessed - Lower Body Dressing: Supported sit to Pharmacist, hospital: Minimal Dentist Method: Sit to Barista: Bedside commode Toileting - Clothing Manipulation and Hygiene: Min guard Where Assessed - Toileting Clothing Manipulation and Hygiene: Sit to stand from 3-in-1 or toilet Equipment Used: Rolling walker Transfers/Ambulation Related to ADLs: performed spt to 3:1 only.  Pt feeling "swimmy headed" ADL Comments: Pt can perform UB adls with set up.  After using commode, pt felt nauseas.  RN aware.  Activity tolerance limited  by dizziness and nausea.    OT Diagnosis: Generalized weakness  OT Problem List: Decreased strength;Decreased activity tolerance;Impaired balance (sitting and/or standing) OT Treatment Interventions: Self-care/ADL training;DME and/or AE instruction;Patient/family education   OT Goals(Current goals can be found in the care plan section) Acute Rehab OT Goals Patient Stated Goal:  get back to walking OT Goal Formulation: With patient Time For Goal Achievement: 08/22/13 Potential to Achieve Goals: Good  Visit Information  Last OT Received On: 08/08/13 Assistance Needed: +1 History of Present Illness: Chelsea Thornton, 62 y.o. female, has a history of pain and functional disability in the left knee due to trauma, tibial plateau fracture in January 2014, and has failed non-surgical conservative treatments; s/p L TKA       Prior Functioning     Home Living Family/patient expects to be discharged to:: Private residence Additional Comments: sister will stay with pt. She has a 3:1 commode and tub/shower.  She will sponge bathe initially Prior Function Level of Independence: Independent with assistive device(s) Communication Communication: No difficulties         Vision/Perception     Cognition  Cognition Arousal/Alertness: Awake/alert Behavior During Therapy: WFL for tasks assessed/performed Overall Cognitive Status: Within Functional Limits for tasks assessed    Extremity/Trunk Assessment Upper Extremity Assessment Upper Extremity Assessment: Overall WFL for tasks assessed     Mobility Transfers Sit to Stand: 4: Min assist Stand to Sit: 4: Min assist Details for Transfer Assistance: cues for hand placement      Exercise     Balance     End of Session OT - End of Session  Activity Tolerance: Patient tolerated treatment well Patient left: in chair;with call bell/phone within reach Nurse Communication:  (nausea/dizziness)  GO     Chelsea Thornton 08/08/2013, 1:15  PM Marica Otter, OTR/L [phone] 08/08/2013

## 2013-08-08 NOTE — Progress Notes (Signed)
Pt very "swimmy headed" and nauseous today following PT this morning, limiting her ability to work with PT and OT. Pt's BP dropped this morning when walking. MD paged and orders given for a bolus. Following bolus, BP better, but pt still reports feeling poorly and states she is not comfortable going home today. MD notified. Order to d/c today cancelled.

## 2013-08-09 LAB — BASIC METABOLIC PANEL
BUN: 13 mg/dL (ref 6–23)
Calcium: 7.9 mg/dL — ABNORMAL LOW (ref 8.4–10.5)
GFR calc non Af Amer: >90 mL/min (ref >90)
Glucose, Bld: 201 mg/dL — ABNORMAL HIGH (ref 70–99)
Sodium: 134 mEq/L — ABNORMAL LOW (ref 137–147)

## 2013-08-09 LAB — CBC
HCT: 25 % — ABNORMAL LOW (ref 36.0–46.0)
Hemoglobin: 8.3 g/dL — ABNORMAL LOW (ref 12.0–15.0)
MCH: 28.6 pg (ref 26.0–34.0)
MCHC: 33.2 g/dL (ref 30.0–36.0)

## 2013-08-09 LAB — GLUCOSE, CAPILLARY
Glucose-Capillary: 176 mg/dL — ABNORMAL HIGH (ref 70–99)
Glucose-Capillary: 184 mg/dL — ABNORMAL HIGH (ref 70–99)

## 2013-08-09 MED ORDER — SULFAMETHOXAZOLE-TMP DS 800-160 MG PO TABS: 1.0000 | ORAL_TABLET | Freq: Two times a day (BID) | ORAL | Status: DC

## 2013-08-09 NOTE — Progress Notes (Signed)
Pt is a candidate for the pneumonia vaccine, however, pt is declining the vaccine for now r/t recent surgery. Has a follow-up appointment with her primary the end of January and ensures nurse she will receive it then.

## 2013-08-09 NOTE — Progress Notes (Signed)
Occupational Therapy Treatment Patient Details Name: Chelsea Thornton MRN: [id number] DOB: 10-Aug-1951 Today's Date: 08/09/2013 Time: 3086-5784 OT Time Calculation (min): 8 min  OT Assessment / Plan / Recommendation  History of present illness Chelsea Thornton, 62 y.o. female, has a history of pain and functional disability in the left knee due to trauma, tibial plateau fracture in January 2014, and has failed non-surgical conservative treatments; s/p L TKA   OT comments  Pt is making good progress.  Nausea resolved:  Min cues for safety  Follow Up Recommendations  No OT follow up;Supervision/Assistance - 24 hour    Barriers to Discharge       Equipment Recommendations  None recommended by OT    Recommendations for Other Services    Frequency Min 2X/week   Progress towards OT Goals Progress towards OT goals: Progressing toward goals  Plan      Precautions / Restrictions Precautions Precautions: Knee Restrictions Weight Bearing Restrictions: No Other Position/Activity Restrictions: WBAT   Pertinent Vitals/Pain Pain, not bad per pt.  Repositioned with ice in chair    ADL  Grooming: Wash/dry hands;Supervision/safety Where Assessed - Grooming: Supported sitting Toilet Transfer: Hydrographic surveyor Method: Sit to Barista: Comfort height toilet;Grab bars Toileting - Architect and Hygiene: Supervision/safety Where Assessed - Engineer, mining and Hygiene: Sit to stand from 3-in-1 or toilet Transfers/Ambulation Related to ADLs: ambulated from bathrrom with min guard.  Cues for safety with walker:  turning it before she moved to keep her body within walker legs.  Also cued to turn it to be in front of her at the sink.      OT Diagnosis:    OT Problem List:   OT Treatment Interventions:     OT Goals(current goals can now be found in the care plan section)    Visit Information  Last OT Received On: 08/09/13 Assistance  Needed: +1 History of Present Illness: Chelsea Thornton, 62 y.o. female, has a history of pain and functional disability in the left knee due to trauma, tibial plateau fracture in January 2014, and has failed non-surgical conservative treatments; s/p L TKA    Subjective Data      Prior Functioning       Cognition  Cognition Arousal/Alertness: Awake/alert Behavior During Therapy: WFL for tasks assessed/performed Overall Cognitive Status: Within Functional Limits for tasks assessed    Mobility  Transfers Sit to Stand: 5: Supervision;From toilet;With upper extremity assist (with grab bar) Details for Transfer Assistance: cues to extend legs when sitting down    Exercises      Balance     End of Session OT - End of Session Activity Tolerance: Patient tolerated treatment well Patient left: in bed;with call bell/phone within reach  GO     [facility] 08/09/2013, 11:05 AM Marica Otter, OTR/L [phone] 08/09/2013

## 2013-08-09 NOTE — Progress Notes (Signed)
Physical Therapy Treatment Patient Details Name: Chelsea Thornton MRN: [id number] DOB: [birth year] Today's Date: 08/09/2013 Time: 3875-6433 PT Time Calculation (min): 28 min  PT Assessment / Plan / Recommendation  History of Present Illness Chelsea Thornton, 62 y.o. female, has a history of pain and functional disability in the left knee due to trauma, tibial plateau fracture in January 2014, and has failed non-surgical conservative treatments; s/p L TKA   PT Comments   POD # 2 pt feeling much better.  Tolerated amb great distance in hallway with no c/o.  BP 90/55 pt states she normally runs low.  No steps as pt has entry level.  Ready for D/C to home.   Follow Up Recommendations  Home health PT     Does the patient have the potential to tolerate intense rehabilitation     Barriers to Discharge        Equipment Recommendations  None recommended by PT    Recommendations for Other Services    Frequency 7X/week   Progress towards PT Goals Progress towards PT goals: Progressing toward goals  Plan      Precautions / Restrictions Precautions Precautions: Knee Restrictions Weight Bearing Restrictions: No Other Position/Activity Restrictions: WBAT    Pertinent Vitals/Pain C/o 3/10    Mobility  Bed Mobility Bed Mobility: Sit to Supine Supine to Sit: 4: Min guard Details for Bed Mobility Assistance: minGuard assist for L LE and increased time Transfers Transfers: Sit to Stand;Stand to Sit Sit to Stand: 5: Supervision;From bed;From chair/3-in-1 Stand to Sit: 5: Supervision;To chair/3-in-1 Details for Transfer Assistance: cues to extend legs when sitting down Ambulation/Gait Ambulation/Gait Assistance: 5: Supervision;4: Min guard Ambulation Distance (Feet): 85 Feet Assistive device: Rolling walker Ambulation/Gait Assistance Details: No c/o dizzyness.  Pt stated "feeling better today".  Amb in hallway tolerated well BP 90/55. Gait Pattern: Step-to pattern Gait velocity: decr     PT Goals (current goals can now be found in the care plan section)    Visit Information  Last PT Received On: 08/09/13 Assistance Needed: +1 History of Present Illness: Chelsea Thornton, 62 y.o. female, has a history of pain and functional disability in the left knee due to trauma, tibial plateau fracture in January 2014, and has failed non-surgical conservative treatments; s/p L TKA    Subjective Data      Cognition       Balance     End of Session PT - End of Session Equipment Utilized During Treatment: Gait belt Activity Tolerance: Patient tolerated treatment well Patient left: in chair;with call bell/phone within reach   Felecia Shelling  PTA [facility]  Acute  Rehab Pager      [phone]

## 2013-08-09 NOTE — Progress Notes (Signed)
Pt to d/c home with home health. No DME needs. AVS reviewed and "My Chart" discussed with pt. Pt capable of verbalizing medications, dressing changes, signs and symptoms of infection, and follow-up appointments. Remains hemodynamically stable. No signs and symptoms of distress. Educated pt to return to ER in the case of SOB, dizziness, or chest pain.

## 2013-08-09 NOTE — Care Management Note (Signed)
    Page 1 of 2   08/09/2013     11:37:03 AM   CARE MANAGEMENT NOTE 08/09/2013  Patient:  Chelsea Thornton, Chelsea Thornton   Account Number:  [id number]  Date Initiated:  08/08/2013  Documentation initiated by:  Colleen Can  Subjective/Objective Assessment:   DX Left total knee arthroplasty     Action/Plan:   CM spoke with patient. Plans are for her to return to her home in [place] where her sister will be caregiver. Pt already has RW, cane and commode seat. Genevieve Norlander will provide [facility] services.   Anticipated DC Date:  08/09/2013   Anticipated DC Plan:  HOME W HOME HEALTH SERVICES  In-house referral  Clinical Social Worker      DC Planning Services  CM consult      [facility] Choice  HOME HEALTH   Choice offered to / List presented to:  C-1 Patient        HH arranged  HH-2 PT      [facility] agency  [facility]   Status of service:  Completed, signed off Medicare Important Message given?   (If response is "NO", the following Medicare IM given date fields will be blank) Date Medicare IM given:   Date Additional Medicare IM given:    Discharge Disposition:  HOME W HOME HEALTH SERVICES  Per UR Regulation:    If discussed at Long Length of Stay Meetings, dates discussed:    Comments:  08/09/2013 Colleen Can BSB RN CCM [phone] Pt for discharge today with [facility] services in place. Genevieve Norlander will start HHpt services 08/11/2012

## 2013-08-09 NOTE — Progress Notes (Signed)
   Subjective: 2 Days Post-Op Procedure(s) (LRB): LEFT TOTAL KNEE ARTHROPLASTY WITH REVISION COMPONENT  (Left)   Patient reports pain as mild, pain controlled. Feels better today than she was yesterday. No events throughout the night. Ready to be discharged.  Objective:   VITALS:   Filed Vitals:   08/09/13 0510  BP: 132/78  Pulse: 98  Temp: 98.4 F (36.9 C)  Resp: 16    Neurovascular intact Dorsiflexion/Plantar flexion intact Incision: dressing C/D/I No cellulitis present Compartment soft  LABS  Recent Labs  08/08/13 0446 08/09/13 0607  HGB 8.6* 8.3*  HCT 25.8* 25.0*  WBC 8.5 9.8  PLT 251 257     Recent Labs  08/08/13 0446 08/09/13 0607  NA 136* 134*  K 4.1 4.1  BUN 20 13  CREATININE 0.43* 0.45*  GLUCOSE 221* 201*     Assessment/Plan: 2 Days Post-Op Procedure(s) (LRB): LEFT TOTAL KNEE ARTHROPLASTY WITH REVISION COMPONENT  (Left) Up with therapy Discharge home with home health Follow up in 2 weeks at [facility]. Follow up with OLIN,Natilie Krabbenhoft D in 2 weeks.  Contact information:  [facility] [address], [address] [place] [address] [phone]    Expected ABLA  Treated with iron and will observe   Obese (BMI 30-39.9)  Estimated body mass index is 30.38 kg/(m^2) as calculated from the following:      Height as of this encounter: 5\' 7"  (1.702 m).      Weight as of this encounter: 87.998 kg (194 lb).  Patient also counseled that weight may inhibit the healing process  Patient counseled that losing weight will help with future health issues       Anastasio Auerbach. Chai Routh   PAC  08/09/2013, 8:13 AM

## 2013-08-11 NOTE — Discharge Summary (Signed)
Physician Discharge Summary  Patient ID: Chelsea Thornton MRN: [id number] DOB/AGE: 10/26/1950 63 y.o.  Admit date: 08/07/2013 Discharge date: 08/09/2013   Procedures:  Procedure(s) (LRB): LEFT TOTAL KNEE ARTHROPLASTY WITH REVISION COMPONENT  (Left)  Attending Physician:  Dr. Durene Romans   Admission Diagnoses:   Left knee OA / pain  Discharge Diagnoses:  Principal Problem:   S/P left TKA Active Problems:   Expected blood loss anemia   Obese  Past Medical History  Diagnosis Date  . Diabetes mellitus   . Hyperlipidemia   . Abnormal CXR 09/24/2012    Dr. Oneita Hurt like little clouds"-was told is improved.Denies SOB or trouble breathing  . Heart attack     '12-"dizzy spells"  . Pneumonia yrs ago  . Complication of anesthesia 06-19-13 left shoulder and arm hurt upon awakening and for several days  . PONV (postoperative nausea and vomiting)     HPI: Chelsea Thornton, 63 y.o. female, has a history of pain and functional disability in the left knee due to trauma, tibial plateau fracture in January 2014, and has failed non-surgical conservative treatments for greater than 12 weeks to includeNSAID's and/or analgesics, supervised PT with diminished ADL's post treatment, use of assistive devices and activity modification. Onset of symptoms was abrupt, starting <1 years ago with gradually worsening course since that time. The patient noted prior procedures on the knee to include ORIF of tibial plateau fracture on the left knee(s). Patient currently rates pain in the left knee(s) at 9 out of 10 with activity. Patient has night pain, worsening of pain with activity and weight bearing, pain that interferes with activities of daily living, pain with passive range of motion, crepitus and joint swelling. Patient has evidence of periarticular osteophytes and joint space narrowing by imaging studies. There is no active infection. Risks, benefits and expectations were discussed with the patient.  Risks including but not limited to the risk of anesthesia, blood clots, nerve damage, blood vessel damage, failure of the prosthesis, infection and up to and including death. Patient understand the risks, benefits and expectations and wishes to proceed with surgery.   PCP: [name], MD   Discharged Condition: good  Hospital Course:  Patient underwent the above stated procedure on 08/07/2013. Patient tolerated the procedure well and brought to the recovery room in good condition and subsequently to the floor.  POD #1 BP: 101/61 ; Pulse: 77 ; Temp: 97.7 F (36.5 C) ; Resp: 18  Patient reports pain as mild, pain controlled. No events throughout the night. States that she feels like she is walking on a new knee. Ready to be discharged home after PT. Neurovascular intact, dorsiflexion/plantar flexion intact, incision: dressing C/D/I, no cellulitis present and compartment soft.   LABS  Basename    HGB  8.6  HCT  25.8   POD #2  BP: 132/78 ; Pulse: 98 ; Temp: 98.4 F (36.9 C) ; Resp: 16  Patient reports pain as mild, pain controlled. Feels better today than she was yesterday. No events throughout the night. Ready to be discharged. Neurovascular intact, dorsiflexion/plantar flexion intact, incision: dressing C/D/I, no cellulitis present and compartment soft.   LABS  Basename    HGB  8.3  HCT  25.0    Discharge Exam: General appearance: alert, cooperative and no distress Extremities: Homans sign is negative, no sign of DVT, no edema, redness or tenderness in the calves or thighs and no ulcers, gangrene or trophic changes  Disposition:   Home or Self Care with follow  up in 2 weeks   Follow-up Information   Follow up with [name], MD. Schedule an appointment as soon as possible for a visit in 2 weeks.   Specialty:  Orthopedic Surgery   Contact information:   [address] [place] [address] [phone]       Discharge Orders   Future Orders Complete  By Expires   Call MD / Call 911  As directed    Comments:     If you experience chest pain or shortness of breath, CALL 911 and be transported to the hospital emergency room.  If you develope a fever above 101 F, pus (white drainage) or increased drainage or redness at the wound, or calf pain, call your surgeon's office.   Change dressing  As directed    Comments:     Maintain surgical dressing for 10-14 days, or until follow up in the clinic.   Constipation Prevention  As directed    Comments:     Drink plenty of fluids.  Prune juice may be helpful.  You may use a stool softener, such as Colace (over the counter) 100 mg twice a day.  Use MiraLax (over the counter) for constipation as needed.   Diet - low sodium heart healthy  As directed    Discharge instructions  As directed    Comments:     Maintain surgical dressing for 10-14 days, or until follow up in the clinic. Follow up in 2 weeks at [facility]. Call with any questions or concerns.   Increase activity slowly as tolerated  As directed    TED hose  As directed    Comments:     Use stockings (TED hose) for 2 weeks on both leg(s).  You may remove them at night for sleeping.   Weight bearing as tolerated  As directed    Questions:     Laterality:     Extremity:          Medication List         alendronate 70 MG tablet  Commonly known as:  FOSAMAX  Take 70 mg by mouth once a week. Take with a full glass of water on an empty stomach.     aspirin 325 MG EC tablet  Take 1 tablet (325 mg total) by mouth 2 (two) times daily.     CALCIUM 600 + D PO  Take 1 tablet by mouth 3 (three) times daily.     DSS 100 MG Caps  Take 100 mg by mouth 2 (two) times daily.     ferrous sulfate 325 (65 FE) MG tablet  Take 1 tablet (325 mg total) by mouth 3 (three) times daily after meals.     glimepiride 2 MG tablet  Commonly known as:  AMARYL  Take 2 mg by mouth daily with breakfast.     HYDROcodone-acetaminophen 7.5-325 MG  per tablet  Commonly known as:  NORCO  Take 1-2 tablets by mouth every 4 (four) hours as needed for moderate pain.     methocarbamol 500 MG tablet  Commonly known as:  ROBAXIN  Take 1 tablet (500 mg total) by mouth every 6 (six) hours as needed for muscle spasms.     polyethylene glycol packet  Commonly known as:  MIRALAX / GLYCOLAX  Take 17 g by mouth 2 (two) times daily.     sulfamethoxazole-trimethoprim 800-160 MG per tablet  Commonly known as:  BACTRIM DS  Take 1 tablet by mouth  2 (two) times daily.         Signed: Anastasio AuerbachMatthew S. Kacelyn Thornton   PAC  08/11/2013, 9:53 AM

## 2013-10-11 NOTE — Progress Notes (Signed)
Surgery on 10/13/13.  Need orders in EPIC.  Thank You.

## 2013-10-12 ENCOUNTER — Encounter (HOSPITAL_COMMUNITY): Payer: Self-pay | Admitting: Pharmacy Technician

## 2013-10-12 ENCOUNTER — Encounter (HOSPITAL_COMMUNITY): Payer: Self-pay | Admitting: *Deleted

## 2013-10-13 ENCOUNTER — Encounter (HOSPITAL_COMMUNITY)
Admission: RE | Disposition: A | Discharge status: Home Health | Payer: Self-pay | Source: Ambulatory Visit | Attending: Orthopedic Surgery

## 2013-10-13 ENCOUNTER — Inpatient Hospital Stay (HOSPITAL_COMMUNITY)
Admission: RE | Admit: 2013-10-13 | Discharge: 2013-10-15 | DRG: 902 | Disposition: A | Discharge status: Home Health | Payer: Commercial Managed Care - PPO | Source: Ambulatory Visit | Attending: Orthopedic Surgery | Admitting: Orthopedic Surgery

## 2013-10-13 ENCOUNTER — Encounter (HOSPITAL_COMMUNITY): Payer: Self-pay | Admitting: *Deleted

## 2013-10-13 ENCOUNTER — Encounter (HOSPITAL_COMMUNITY): Payer: Commercial Managed Care - PPO | Admitting: Certified Registered Nurse Anesthetist

## 2013-10-13 ENCOUNTER — Ambulatory Visit (HOSPITAL_COMMUNITY): Payer: Commercial Managed Care - PPO | Admitting: Certified Registered Nurse Anesthetist

## 2013-10-13 DIAGNOSIS — IMO0002 Reserved for concepts with insufficient information to code with codable children: Secondary | ICD-10-CM | POA: Diagnosis present

## 2013-10-13 DIAGNOSIS — E119 Type 2 diabetes mellitus without complications: Secondary | ICD-10-CM | POA: Diagnosis present

## 2013-10-13 DIAGNOSIS — Y831 Surgical operation with implant of artificial internal device as the cause of abnormal reaction of the patient, or of later complication, without mention of misadventure at the time of the procedure: Secondary | ICD-10-CM | POA: Diagnosis present

## 2013-10-13 DIAGNOSIS — Z96659 Presence of unspecified artificial knee joint: Secondary | ICD-10-CM

## 2013-10-13 DIAGNOSIS — Y838 Other surgical procedures as the cause of abnormal reaction of the patient, or of later complication, without mention of misadventure at the time of the procedure: Secondary | ICD-10-CM | POA: Diagnosis present

## 2013-10-13 DIAGNOSIS — T8149XA Infection following a procedure, other surgical site, initial encounter: Secondary | ICD-10-CM | POA: Diagnosis present

## 2013-10-13 DIAGNOSIS — Z79899 Other long term (current) drug therapy: Secondary | ICD-10-CM

## 2013-10-13 DIAGNOSIS — T8131XA Disruption of external operation (surgical) wound, not elsewhere classified, initial encounter: Principal | ICD-10-CM | POA: Diagnosis present

## 2013-10-13 DIAGNOSIS — Z881 Allergy status to other antibiotic agents status: Secondary | ICD-10-CM

## 2013-10-13 DIAGNOSIS — E785 Hyperlipidemia, unspecified: Secondary | ICD-10-CM | POA: Diagnosis present

## 2013-10-13 DIAGNOSIS — Z91013 Allergy to seafood: Secondary | ICD-10-CM

## 2013-10-13 DIAGNOSIS — T8140XA Infection following a procedure, unspecified, initial encounter: Secondary | ICD-10-CM | POA: Diagnosis present

## 2013-10-13 DIAGNOSIS — Z888 Allergy status to other drugs, medicaments and biological substances status: Secondary | ICD-10-CM

## 2013-10-13 DIAGNOSIS — I252 Old myocardial infarction: Secondary | ICD-10-CM

## 2013-10-13 HISTORY — PX: IRRIGATION AND DEBRIDEMENT KNEE: SHX5185

## 2013-10-13 LAB — URINALYSIS, ROUTINE W REFLEX MICROSCOPIC
BILIRUBIN URINE: NEGATIVE
Glucose, UA: 250 mg/dL — AB
Hgb urine dipstick: NEGATIVE
Ketones, ur: NEGATIVE mg/dL
Leukocytes, UA: NEGATIVE
NITRITE: NEGATIVE
Protein, ur: NEGATIVE mg/dL
Specific Gravity, Urine: 1.011 (ref 1.005–1.030)
Urobilinogen, UA: 0.2 mg/dL (ref 0.0–1.0)
pH: 7 (ref 5.0–8.0)

## 2013-10-13 LAB — CBC
HEMATOCRIT: 37.3 % (ref 36.0–46.0)
HEMOGLOBIN: 12.3 g/dL (ref 12.0–15.0)
MCH: 27.8 pg (ref 26.0–34.0)
MCHC: 33 g/dL (ref 30.0–36.0)
MCV: 84.4 fL (ref 78.0–100.0)
Platelets: 311 10*3/uL (ref 150–400)
RBC: 4.42 MIL/uL (ref 3.87–5.11)
RDW: 12.9 % (ref 11.5–15.5)
WBC: 7.2 10*3/uL (ref 4.0–10.5)

## 2013-10-13 LAB — TYPE AND SCREEN
ABO/RH(D): O POS
Antibody Screen: NEGATIVE

## 2013-10-13 LAB — BASIC METABOLIC PANEL
BUN: 12 mg/dL (ref 6–23)
CALCIUM: 9.3 mg/dL (ref 8.4–10.5)
CO2: 28 mEq/L (ref 19–32)
CREATININE: 0.48 mg/dL — AB (ref 0.50–1.10)
Chloride: 98 mEq/L (ref 96–112)
GFR calc Af Amer: >90 mL/min (ref >90)
GLUCOSE: 207 mg/dL — AB (ref 70–99)
Potassium: 4.2 mEq/L (ref 3.7–5.3)
Sodium: 138 mEq/L (ref 137–147)

## 2013-10-13 LAB — GLUCOSE, CAPILLARY
GLUCOSE-CAPILLARY: 212 mg/dL — AB (ref 70–99)
GLUCOSE-CAPILLARY: 272 mg/dL — AB (ref 70–99)
Glucose-Capillary: 175 mg/dL — ABNORMAL HIGH (ref 70–99)
Glucose-Capillary: 184 mg/dL — ABNORMAL HIGH (ref 70–99)

## 2013-10-13 LAB — PROTIME-INR
INR: 0.94 (ref 0.00–1.49)
Prothrombin Time: 12.4 seconds (ref 11.6–15.2)

## 2013-10-13 LAB — APTT: aPTT: 35 seconds (ref 24–37)

## 2013-10-13 SURGERY — IRRIGATION AND DEBRIDEMENT KNEE
Anesthesia: General | Site: Knee | Laterality: Left

## 2013-10-13 MED ORDER — ASPIRIN EC 325 MG PO TBEC
325.0000 mg | DELAYED_RELEASE_TABLET | Freq: Two times a day (BID) | ORAL | Status: DC
  Administered 2013-10-14 – 2013-10-15 (×3): 325 mg via ORAL
  Filled 2013-10-13 (×5): qty 1

## 2013-10-13 MED ORDER — VANCOMYCIN HCL IN DEXTROSE 1-5 GM/200ML-% IV SOLN
INTRAVENOUS | Status: AC
  Filled 2013-10-13: qty 200

## 2013-10-13 MED ORDER — PHENOL 1.4 % MT LIQD: 1.0000 | OROMUCOSAL | Status: DC | PRN

## 2013-10-13 MED ORDER — SODIUM CHLORIDE 0.9 % IR SOLN
Status: DC | PRN
  Administered 2013-10-13: 4000 mL

## 2013-10-13 MED ORDER — DIPHENHYDRAMINE HCL 25 MG PO CAPS: 25.0000 mg | ORAL_CAPSULE | Freq: Four times a day (QID) | ORAL | Status: DC | PRN

## 2013-10-13 MED ORDER — MIDAZOLAM HCL 2 MG/2ML IJ SOLN
INTRAMUSCULAR | Status: AC
  Filled 2013-10-13: qty 2

## 2013-10-13 MED ORDER — GLIMEPIRIDE 2 MG PO TABS
2.0000 mg | ORAL_TABLET | Freq: Every day | ORAL | Status: DC
  Administered 2013-10-14 – 2013-10-15 (×2): 2 mg via ORAL
  Filled 2013-10-13 (×3): qty 1

## 2013-10-13 MED ORDER — ONDANSETRON HCL 4 MG/2ML IJ SOLN
INTRAMUSCULAR | Status: AC
  Filled 2013-10-13: qty 2

## 2013-10-13 MED ORDER — LIDOCAINE HCL (CARDIAC) 20 MG/ML IV SOLN
INTRAVENOUS | Status: AC
  Filled 2013-10-13: qty 5

## 2013-10-13 MED ORDER — ACETAMINOPHEN 10 MG/ML IV SOLN
INTRAVENOUS | Status: DC | PRN
  Administered 2013-10-13: 1000 mg via INTRAVENOUS

## 2013-10-13 MED ORDER — LACTATED RINGERS IV SOLN
INTRAVENOUS | Status: DC | PRN
  Administered 2013-10-13 (×2): via INTRAVENOUS

## 2013-10-13 MED ORDER — METHOCARBAMOL 500 MG PO TABS: 500.0000 mg | ORAL_TABLET | Freq: Four times a day (QID) | ORAL | Status: DC | PRN

## 2013-10-13 MED ORDER — ALENDRONATE SODIUM 70 MG PO TABS: 70.0000 mg | ORAL_TABLET | ORAL | Status: DC

## 2013-10-13 MED ORDER — ONDANSETRON HCL 4 MG/2ML IJ SOLN
INTRAMUSCULAR | Status: DC | PRN
  Administered 2013-10-13: 4 mg via INTRAVENOUS

## 2013-10-13 MED ORDER — MIDAZOLAM HCL 5 MG/5ML IJ SOLN
INTRAMUSCULAR | Status: DC | PRN
Start: 2013-10-13 — End: 2013-10-13
  Administered 2013-10-13: 1 mg via INTRAVENOUS

## 2013-10-13 MED ORDER — VANCOMYCIN HCL 1000 MG IV SOLR
1000.0000 mg | INTRAVENOUS | Status: DC | PRN
  Administered 2013-10-13: 1000 mg via INTRAVENOUS

## 2013-10-13 MED ORDER — CHLORHEXIDINE GLUCONATE 4 % EX LIQD: 60.0000 mL | Freq: Once | CUTANEOUS | Status: DC

## 2013-10-13 MED ORDER — FERROUS SULFATE 325 (65 FE) MG PO TABS
325.0000 mg | ORAL_TABLET | Freq: Three times a day (TID) | ORAL | Status: DC
  Administered 2013-10-14 – 2013-10-15 (×4): 325 mg via ORAL
  Filled 2013-10-13 (×8): qty 1

## 2013-10-13 MED ORDER — EPHEDRINE SULFATE 50 MG/ML IJ SOLN
INTRAMUSCULAR | Status: DC | PRN
  Administered 2013-10-13 (×3): 10 mg via INTRAVENOUS

## 2013-10-13 MED ORDER — CELECOXIB 200 MG PO CAPS
200.0000 mg | ORAL_CAPSULE | Freq: Two times a day (BID) | ORAL | Status: DC
  Administered 2013-10-13 – 2013-10-15 (×4): 200 mg via ORAL
  Filled 2013-10-13 (×5): qty 1

## 2013-10-13 MED ORDER — HYDROMORPHONE HCL PF 1 MG/ML IJ SOLN: 0.2500 mg | INTRAMUSCULAR | Status: DC | PRN

## 2013-10-13 MED ORDER — LACTATED RINGERS IV SOLN: INTRAVENOUS | Status: DC

## 2013-10-13 MED ORDER — ONDANSETRON HCL 4 MG/2ML IJ SOLN
4.0000 mg | Freq: Four times a day (QID) | INTRAMUSCULAR | Status: DC | PRN
  Administered 2013-10-14: 4 mg via INTRAVENOUS
  Filled 2013-10-13: qty 2

## 2013-10-13 MED ORDER — FENTANYL CITRATE 0.05 MG/ML IJ SOLN
INTRAMUSCULAR | Status: AC
  Filled 2013-10-13: qty 2

## 2013-10-13 MED ORDER — METOCLOPRAMIDE HCL 10 MG PO TABS: 5.0000 mg | ORAL_TABLET | Freq: Three times a day (TID) | ORAL | Status: DC | PRN

## 2013-10-13 MED ORDER — PHENYLEPHRINE HCL 10 MG/ML IJ SOLN
INTRAMUSCULAR | Status: DC | PRN
Start: 2013-10-13 — End: 2013-10-13
  Administered 2013-10-13 (×2): 20 ug via INTRAVENOUS

## 2013-10-13 MED ORDER — SODIUM CHLORIDE 0.9 % IJ SOLN
10.0000 mL | INTRAMUSCULAR | Status: DC | PRN
  Administered 2013-10-14: 10 mL

## 2013-10-13 MED ORDER — DOCUSATE SODIUM 100 MG PO CAPS
100.0000 mg | ORAL_CAPSULE | Freq: Two times a day (BID) | ORAL | Status: DC
  Administered 2013-10-13: 100 mg via ORAL

## 2013-10-13 MED ORDER — FENTANYL CITRATE 0.05 MG/ML IJ SOLN
INTRAMUSCULAR | Status: DC | PRN
  Administered 2013-10-13: 50 ug via INTRAVENOUS

## 2013-10-13 MED ORDER — HYDROMORPHONE HCL PF 1 MG/ML IJ SOLN: 0.5000 mg | INTRAMUSCULAR | Status: DC | PRN

## 2013-10-13 MED ORDER — LIDOCAINE HCL (CARDIAC) 20 MG/ML IV SOLN
INTRAVENOUS | Status: DC | PRN
  Administered 2013-10-13: 50 mg via INTRAVENOUS

## 2013-10-13 MED ORDER — HYDROCODONE-ACETAMINOPHEN 7.5-325 MG PO TABS
1.0000 | ORAL_TABLET | ORAL | Status: DC
  Filled 2013-10-13: qty 2

## 2013-10-13 MED ORDER — INSULIN ASPART 100 UNIT/ML ~~LOC~~ SOLN: 0.0000 [IU] | Freq: Three times a day (TID) | SUBCUTANEOUS | Status: DC

## 2013-10-13 MED ORDER — ALUM & MAG HYDROXIDE-SIMETH 200-200-20 MG/5ML PO SUSP: 30.0000 mL | ORAL | Status: DC | PRN

## 2013-10-13 MED ORDER — RIFAMPIN 300 MG PO CAPS
300.0000 mg | ORAL_CAPSULE | Freq: Two times a day (BID) | ORAL | Status: DC
  Administered 2013-10-13 – 2013-10-15 (×4): 300 mg via ORAL
  Filled 2013-10-13 (×5): qty 1

## 2013-10-13 MED ORDER — CEFAZOLIN SODIUM-DEXTROSE 2-3 GM-% IV SOLR
2.0000 g | INTRAVENOUS | Status: AC
  Administered 2013-10-13: 2 g via INTRAVENOUS

## 2013-10-13 MED ORDER — ONDANSETRON HCL 4 MG PO TABS: 4.0000 mg | ORAL_TABLET | Freq: Four times a day (QID) | ORAL | Status: DC | PRN

## 2013-10-13 MED ORDER — FLEET ENEMA 7-19 GM/118ML RE ENEM: 1.0000 | ENEMA | Freq: Once | RECTAL | Status: AC | PRN

## 2013-10-13 MED ORDER — METHOCARBAMOL 100 MG/ML IJ SOLN
500.0000 mg | Freq: Four times a day (QID) | INTRAMUSCULAR | Status: DC | PRN
  Filled 2013-10-13: qty 5

## 2013-10-13 MED ORDER — ACETAMINOPHEN 10 MG/ML IV SOLN
1000.0000 mg | Freq: Once | INTRAVENOUS | Status: DC
  Filled 2013-10-13: qty 100

## 2013-10-13 MED ORDER — MENTHOL 3 MG MT LOZG: 1.0000 | LOZENGE | OROMUCOSAL | Status: DC | PRN

## 2013-10-13 MED ORDER — BISACODYL 10 MG RE SUPP: 10.0000 mg | Freq: Every day | RECTAL | Status: DC | PRN

## 2013-10-13 MED ORDER — PROMETHAZINE HCL 25 MG/ML IJ SOLN: 6.2500 mg | INTRAMUSCULAR | Status: DC | PRN

## 2013-10-13 MED ORDER — CEFAZOLIN SODIUM-DEXTROSE 2-3 GM-% IV SOLR
INTRAVENOUS | Status: AC
  Filled 2013-10-13: qty 50

## 2013-10-13 MED ORDER — FENTANYL CITRATE 0.05 MG/ML IJ SOLN
INTRAMUSCULAR | Status: AC
  Filled 2013-10-13: qty 10

## 2013-10-13 MED ORDER — VANCOMYCIN HCL IN DEXTROSE 1-5 GM/200ML-% IV SOLN
1000.0000 mg | Freq: Three times a day (TID) | INTRAVENOUS | Status: DC
  Administered 2013-10-13 – 2013-10-15 (×6): 1000 mg via INTRAVENOUS
  Filled 2013-10-13 (×7): qty 200

## 2013-10-13 MED ORDER — PROPOFOL 10 MG/ML IV BOLUS
INTRAVENOUS | Status: DC | PRN
  Administered 2013-10-13: 30 mg via INTRAVENOUS
  Administered 2013-10-13: 150 mg via INTRAVENOUS

## 2013-10-13 MED ORDER — METOCLOPRAMIDE HCL 5 MG/ML IJ SOLN: 5.0000 mg | Freq: Three times a day (TID) | INTRAMUSCULAR | Status: DC | PRN

## 2013-10-13 MED ORDER — 0.9 % SODIUM CHLORIDE (POUR BTL) OPTIME
TOPICAL | Status: DC | PRN
  Administered 2013-10-13: 1000 mL

## 2013-10-13 MED ORDER — POLYETHYLENE GLYCOL 3350 17 G PO PACK: 17.0000 g | PACK | Freq: Every day | ORAL | Status: DC | PRN

## 2013-10-13 MED ORDER — PROPOFOL 10 MG/ML IV BOLUS
INTRAVENOUS | Status: AC
  Filled 2013-10-13: qty 20

## 2013-10-13 MED ORDER — SODIUM CHLORIDE 0.9 % IV SOLN
INTRAVENOUS | Status: DC
  Administered 2013-10-13 – 2013-10-15 (×4): via INTRAVENOUS
  Filled 2013-10-13 (×9): qty 1000

## 2013-10-13 SURGICAL SUPPLY — 57 items
ADH SKN CLS APL DERMABOND .7 (GAUZE/BANDAGES/DRESSINGS)
BAG SPEC THK2 15X12 ZIP CLS (MISCELLANEOUS) ×1
BAG ZIPLOCK 12X15 (MISCELLANEOUS) ×3 IMPLANT
BANDAGE ELASTIC 6 VELCRO ST LF (GAUZE/BANDAGES/DRESSINGS) ×3 IMPLANT
BANDAGE ESMARK 6X9 LF (GAUZE/BANDAGES/DRESSINGS) ×1 IMPLANT
BNDG CMPR 82X61 PLY HI ABS (GAUZE/BANDAGES/DRESSINGS) ×1
BNDG CMPR 9X6 STRL LF SNTH (GAUZE/BANDAGES/DRESSINGS) ×1
BNDG CONFORM 6X.82 1P STRL (GAUZE/BANDAGES/DRESSINGS) ×2 IMPLANT
BNDG ESMARK 6X9 LF (GAUZE/BANDAGES/DRESSINGS) ×3
CONT SPECI 4OZ STER CLIK (MISCELLANEOUS) ×1 IMPLANT
CUFF TOURN SGL QUICK 34 (TOURNIQUET CUFF) ×3
CUFF TRNQT CYL 34X4X40X1 (TOURNIQUET CUFF) ×1 IMPLANT
DERMABOND ADVANCED (GAUZE/BANDAGES/DRESSINGS)
DERMABOND ADVANCED .7 DNX12 (GAUZE/BANDAGES/DRESSINGS) ×1 IMPLANT
DRAPE EXTREMITY T 121X128X90 (DRAPE) ×3 IMPLANT
DRSG ADAPTIC 3X8 NADH LF (GAUZE/BANDAGES/DRESSINGS) ×1 IMPLANT
DRSG AQUACEL AG ADV 3.5X10 (GAUZE/BANDAGES/DRESSINGS) ×1 IMPLANT
DRSG MEPILEX BORDER 4X4 (GAUZE/BANDAGES/DRESSINGS) ×2 IMPLANT
DRSG TEGADERM 4X4.75 (GAUZE/BANDAGES/DRESSINGS) ×3 IMPLANT
DURAPREP 26ML APPLICATOR (WOUND CARE) ×3 IMPLANT
ELECT REM PT RETURN 9FT ADLT (ELECTROSURGICAL) ×3
ELECTRODE REM PT RTRN 9FT ADLT (ELECTROSURGICAL) ×1 IMPLANT
EVACUATOR 1/8 PVC DRAIN (DRAIN) ×3 IMPLANT
GAUZE SPONGE 2X2 8PLY STRL LF (GAUZE/BANDAGES/DRESSINGS) ×1 IMPLANT
GAUZE XEROFORM 5X9 LF (GAUZE/BANDAGES/DRESSINGS) ×2 IMPLANT
GLOVE BIOGEL PI IND STRL 6.5 (GLOVE) IMPLANT
GLOVE BIOGEL PI IND STRL 7.5 (GLOVE) ×1 IMPLANT
GLOVE BIOGEL PI IND STRL 8 (GLOVE) ×1 IMPLANT
GLOVE BIOGEL PI INDICATOR 6.5 (GLOVE) ×2
GLOVE BIOGEL PI INDICATOR 7.5 (GLOVE) ×2
GLOVE BIOGEL PI INDICATOR 8 (GLOVE) ×2
GLOVE ECLIPSE 8.0 STRL XLNG CF (GLOVE) ×2 IMPLANT
GLOVE ORTHO TXT STRL SZ7.5 (GLOVE) ×6 IMPLANT
GLOVE SURG ORTHO 8.0 STRL STRW (GLOVE) ×3 IMPLANT
GLOVE SURG SS PI 6.5 STRL IVOR (GLOVE) ×2 IMPLANT
GOWN SPEC L3 XXLG W/TWL (GOWN DISPOSABLE) ×4 IMPLANT
GOWN STRL REUS W/TWL LRG LVL3 (GOWN DISPOSABLE) ×5 IMPLANT
HANDPIECE INTERPULSE COAX TIP (DISPOSABLE) ×3
KIT BASIN OR (CUSTOM PROCEDURE TRAY) ×3 IMPLANT
MANIFOLD NEPTUNE II (INSTRUMENTS) ×3 IMPLANT
PACK TOTAL JOINT (CUSTOM PROCEDURE TRAY) ×3 IMPLANT
PAD ABD 8X10 STRL (GAUZE/BANDAGES/DRESSINGS) ×5 IMPLANT
PADDING CAST COTTON 6X4 STRL (CAST SUPPLIES) ×3 IMPLANT
POSITIONER SURGICAL ARM (MISCELLANEOUS) ×1 IMPLANT
SET HNDPC FAN SPRY TIP SCT (DISPOSABLE) ×1 IMPLANT
SPONGE GAUZE 2X2 STER 10/PKG (GAUZE/BANDAGES/DRESSINGS) ×2
SPONGE GAUZE 4X4 12PLY (GAUZE/BANDAGES/DRESSINGS) ×3 IMPLANT
STAPLER VISISTAT 35W (STAPLE) ×1 IMPLANT
SUT ETHILON 2 0 PS N (SUTURE) ×8 IMPLANT
SUT MNCRL AB 4-0 PS2 18 (SUTURE) ×1 IMPLANT
SUT VIC AB 1 CT1 36 (SUTURE) ×6 IMPLANT
SUT VIC AB 2-0 CT1 27 (SUTURE)
SUT VIC AB 2-0 CT1 TAPERPNT 27 (SUTURE) ×3 IMPLANT
SUT VLOC 180 0 24IN GS25 (SUTURE) ×1 IMPLANT
SWAB COLLECTION DEVICE MRSA (MISCELLANEOUS) ×1 IMPLANT
TOWEL OR 17X26 10 PK STRL BLUE (TOWEL DISPOSABLE) ×6 IMPLANT
TUBE ANAEROBIC SPECIMEN COL (MISCELLANEOUS) ×1 IMPLANT

## 2013-10-13 NOTE — Brief Op Note (Signed)
10/13/2013  2:29 PM  PATIENT:  Chelsea Thornton  63 y.o. female  PRE-OPERATIVE DIAGNOSIS:  WOUND DEHISCENCE LEFT KNEE  POST-OPERATIVE DIAGNOSIS:  WOUND DEHISCENCE LEFT KNEE  PROCEDURE:  Procedure(s): INCISIONAL DEBRIDEMENT LEFT KNEE (Left)  SURGEON:  Surgeon(s) and Role:    * Shelda PalMatthew D Arnett Duddy, MD - Primary  PHYSICIAN ASSISTANT: Lanney GinsMatthew Babish, PA-C   ANESTHESIA:   general  EBL:  Total I/O In: 1200 [I.V.:1200] Out: -   BLOOD ADMINISTERED:none  DRAINS: (1 medium) Hemovact drain(s) in the left knee superficially with  Suction Open   LOCAL MEDICATIONS USED:  NONE  SPECIMEN:  No Specimen  DISPOSITION OF SPECIMEN:  N/A  COUNTS:  YES  TOURNIQUET:   Total Tourniquet Time Documented: Thigh (Left) - 13 minutes Total: Thigh (Left) - 13 minutes   DICTATION: .Other Dictation: Dictation Number [id number]  PLAN OF CARE: Admit to inpatient   PATIENT DISPOSITION:  PACU - hemodynamically stable.   Delay start of Pharmacological VTE agent (>24hrs) due to surgical blood loss or risk of bleeding: no

## 2013-10-13 NOTE — Anesthesia Postprocedure Evaluation (Signed)
Anesthesia Post Note  Patient: Chelsea PrinceNancy Thornton  Procedure(s) Performed: Procedure(s) (LRB): INCISIONAL DEBRIDEMENT LEFT KNEE (Left)  Anesthesia type: General  Patient location: PACU  Post pain: Pain level controlled  Post assessment: Post-op Vital signs reviewed  Last Vitals:  Filed Vitals:   10/13/13 1415  BP: 157/71  Pulse: 100  Temp: 36.4 C  Resp: 17    Post vital signs: Reviewed  Level of consciousness: sedated  Complications: No apparent anesthesia complications

## 2013-10-13 NOTE — Progress Notes (Signed)
Peripherally Inserted Central Catheter/Midline Placement  The IV Nurse has discussed with the patient and/or persons authorized to consent for the patient, the purpose of this procedure and the potential benefits and risks involved with this procedure.  The benefits include less needle sticks, lab draws from the catheter and patient may be discharged home with the catheter.  Risks include, but not limited to, infection, bleeding, blood clot (thrombus formation), and puncture of an artery; nerve damage and irregular heat beat.  Alternatives to this procedure were also discussed.  PICC/Midline Placement Documentation  PICC / Midline Single Lumen 10/13/13 PICC Right Basilic 40 cm 0 cm (Active)       Netta Corriganhomas, Aldene Hendon L 10/13/2013, 9:02 PM

## 2013-10-13 NOTE — Preoperative (Signed)
Beta Blockers   Reason not to administer Beta Blockers:Not Applicable 

## 2013-10-13 NOTE — Anesthesia Preprocedure Evaluation (Addendum)
Anesthesia Evaluation  Patient identified by MRN, date of birth, ID band Patient awake    Reviewed: Allergy & Precautions, H&P , NPO status , Patient's Chart, lab work & pertinent test results  History of Anesthesia Complications (+) PONV and history of anesthetic complications  Airway Mallampati: II TM Distance: >3 FB Neck ROM: full    Dental no notable dental hx. (+) Teeth Intact, Dental Advisory Given   Pulmonary pneumonia -, resolved,  Pneumonia 2/14 breath sounds clear to auscultation  Pulmonary exam normal       Cardiovascular Exercise Tolerance: Good + Past MI and + Cardiac Stents Rhythm:regular Rate:Normal  MI and stent 2012   Neuro/Psych negative neurological ROS  negative psych ROS   GI/Hepatic negative GI ROS, Neg liver ROS,   Endo/Other  diabetes, Well Controlled, Type 2, Oral Hypoglycemic Agents  Renal/GU negative Renal ROS     Musculoskeletal   Abdominal   Peds  Hematology negative hematology ROS (+) anemia ,   Anesthesia Other Findings   Reproductive/Obstetrics negative OB ROS                          Anesthesia Physical Anesthesia Plan  ASA: III  Anesthesia Plan: General   Post-op Pain Management:    Induction: Intravenous  Airway Management Planned: LMA  Additional Equipment:   Intra-op Plan:   Post-operative Plan: Extubation in OR  Informed Consent: I have reviewed the patients History and Physical, chart, labs and discussed the procedure including the risks, benefits and alternatives for the proposed anesthesia with the patient or authorized representative who has indicated his/her understanding and acceptance.   Dental advisory given  Plan Discussed with: CRNA  Anesthesia Plan Comments:         Anesthesia Quick Evaluation

## 2013-10-13 NOTE — Progress Notes (Signed)
Inpatient Diabetes Program Recommendations  AACE/ADA: New Consensus Statement on Inpatient Glycemic Control (2013)  Target Ranges:  Preprandial less than 140 mg/dL      Peak postprandial:   less than 180 mg/dL (1-2 hours)      Critically ill patients:  140 - 180 mg/dL   Reason for Visit: No cbg's ordered with known history of DM2 Outpatient Diabetes medications:Amaryl 2 mg Current orders for Inpatient glycemic control: NONE  Pt here with wound dehiscence following surgery. Please order cbg's q 4hrs if not yet eating; once eating, order tidwc and use sensitive correction tidwc. Uncontrolled glucose can result in poor wound healing. HgbA1C would be helpful as well if glucose control has been a problem prior to and following surgery. Thank you, Lenor CoffinAnn Xion Debruyne, RN, CNS, Diabetes Coordinator [phone])

## 2013-10-13 NOTE — Transfer of Care (Signed)
Immediate Anesthesia Transfer of Care Note  Patient: Chelsea PrinceNancy Thornton  Procedure(s) Performed: Procedure(s): INCISIONAL DEBRIDEMENT LEFT KNEE (Left)  Patient Location: PACU  Anesthesia Type:General  Level of Consciousness: awake, alert , oriented and patient cooperative  Airway & Oxygen Therapy: Patient Spontanous Breathing and Patient connected to face mask oxygen  Post-op Assessment: Report given to PACU RN, Post -op Vital signs reviewed and stable and Patient moving all extremities  Post vital signs: Reviewed and stable  Complications: No apparent anesthesia complications

## 2013-10-13 NOTE — Progress Notes (Signed)
ANTIBIOTIC CONSULT NOTE - INITIAL  Pharmacy Consult for Vancomycin Indication: left knee wound infection  Allergies  Allergen Reactions  . Bee Venom Anaphylaxis  . Shrimp [Shellfish Allergy] Anaphylaxis    Throat swells hives  . Doxycycline Nausea And Vomiting  . Prednisone Other (See Comments)    Patient states that she can take this medication now    Patient Measurements: Height: 5\' 7"  (170.2 cm) Weight: 199 lb 4 oz (90.379 kg) IBW/kg (Calculated) : 61.6  Vital Signs: Temp: 97.2 F (36.2 C) (03/06 1545) Temp src: Oral (03/06 1015) BP: 140/79 mmHg (03/06 1545) Pulse Rate: 102 (03/06 1545) Intake/Output from previous day:   Intake/Output from this shift: Total I/O In: 1400 [I.V.:1200; IV Piggyback:200] Out: -   Labs:  Recent Labs  10/13/13 1045  WBC 7.2  HGB 12.3  PLT 311  CREATININE 0.48*   Estimated Creatinine Clearance: 85.2 ml/min (by C-G formula based on Cr of 0.48). No results found for this basename: VANCOTROUGH, VANCOPEAK, VANCORANDOM, GENTTROUGH, GENTPEAK, GENTRANDOM, TOBRATROUGH, TOBRAPEAK, TOBRARND, AMIKACINPEAK, AMIKACINTROU, AMIKACIN,  in the last 72 hours   Microbiology: No results found for this or any previous visit (from the past 720 hour(s)).  Medical History: Past Medical History  Diagnosis Date  . Diabetes mellitus   . Hyperlipidemia   . Abnormal CXR 09/24/2012    Dr. Oneita Hurtlance"looks like little clouds"-was told is improved.Denies SOB or trouble breathing  . Heart attack     '12-"dizzy spells"  . Pneumonia yrs ago  . Complication of anesthesia 06-19-13 left shoulder and arm hurt upon awakening and for several days  . PONV (postoperative nausea and vomiting)      Assessment: [age] yoF presents with left knee pain with mild drainage.  Patient has history of prior procedures on the knee to include ORIF of tibial plateau fracture on the left knee as well as recent left total knee arthroplasty on 08/07/2013.  Now, s/p left knee incisional  debridement today for left knee wound.  Hemovact drain in the left knee.  Patient received Cefazolin 2g pre-op and pharmacy has been consulted to begin vancomycin post-op.  WBC WNL  Afebrile  SCr 0.48, CrCl~83 ml/min (normalized, using adjusted SCr 0.8)  No cultures drawn.  Goal of Therapy:  Vancomycin trough level 15-20 mcg/ml  Plan:  1.  Vancomycin 1g IV q8h. 2.  Check trough level at steady state. F/u clinical course and length of therapy.  Clance Bollunyon, Jazzlyn Huizenga 10/13/2013,4:05 PM

## 2013-10-13 NOTE — Interval H&P Note (Signed)
History and Physical Interval Note:  10/13/2013 12:59 PM  Chelsea Thornton  has presented today for surgery, with the diagnosis of WOUND DEHISCENCE LEFT KNEE  The various methods of treatment have been discussed with the patient and family. After consideration of risks, benefits and other options for treatment, the patient has consented to  Procedure(s): INCISIONAL DEBRIDEMENT LEFT KNEE (Left) as a surgical intervention .  The patient's history has been reviewed, patient examined, no change in status, stable for surgery.  I have reviewed the patient's chart and labs.  Questions were answered to the patient's satisfaction.     [name],Bobi Daudelin D

## 2013-10-13 NOTE — Progress Notes (Signed)
UR completed. Umar Patmon RN CCM Case Mgmt phone [phone] 

## 2013-10-13 NOTE — H&P (Signed)
Chelsea Thornton is an 63 y.o. female.    Procedure:     Left knee incisional debridement  Chief Complaint:     Left knee wound dehiscence    HPI:      Chelsea Thornton, 63 y.o. female, has a history of pain and functional disability in the left knee due to trauma, tibial plateau fracture in January 2014, and has failed non-surgical conservative treatments for greater than 12 weeks to include NSAID's and/or analgesics, supervised PT with diminished ADL's post treatment, use of assistive devices and activity modification. Onset of symptoms was abrupt, starting <1 years ago with gradually worsening course since that time. The patient noted prior procedures on the knee to include ORIF of tibial plateau fracture on the left knee as well as recent left total knee arthroplasty on 08/07/2013 . Patient currently rates pain in the left knee(s) at 7 out of 10 with activity. Patient has recently returned to the clinic for follow up and had some a small amount of drainage from the mid distal portion of the wound, as well as eschar tissue around the area.  There was no odor to the discharge. Patient has evidence previous total knee arthroplasty by imaging studies. There is no active infection. Risks, benefits and expectations were discussed with the patient. Risks including but not limited to the risk of anesthesia, blood clots, nerve damage, blood vessel damage, failure of the prosthesis, infection and up to and including death. Patient understand the risks, benefits and expectations and wishes to proceed with surgery.   PCP:  [name], MD  D/C Plans: Home with HHPT   Post-op Meds: No Rx given   Tranexamic Acid: Not to be given - previous MI   Decadron: Not to be given - DM   FYI: ASA post-op  PMH: Past Medical History  Diagnosis Date  . Diabetes mellitus   . Hyperlipidemia   . Abnormal CXR 09/24/2012    Dr. Oneita Hurt like little clouds"-was told is improved.Denies SOB or trouble breathing  .  Heart attack     '12-"dizzy spells"  . Pneumonia yrs ago  . Complication of anesthesia 06-19-13 left shoulder and arm hurt upon awakening and for several days  . PONV (postoperative nausea and vomiting)     PSH: Past Surgical History  Procedure Laterality Date  . Total knee arthroplasty  2007    Right  . Video bronchoscopy  11/03/2011    Procedure: VIDEO BRONCHOSCOPY WITHOUT FLUORO;  Surgeon: Barbaraann Share, MD;  Location: [facility];  Service: Cardiopulmonary;  Laterality: Bilateral;  . Orif tibia plateau Left 09/25/2012    Procedure: OPEN REDUCTION INTERNAL FIXATION (ORIF) TIBIAL PLATEAU;  Surgeon: Eugenia Mcalpine, MD;  Location: [facility];  Service: Orthopedics;  Laterality: Left;  . Cardiac catheterization      '12- stent placed-[facility]  . Toe surgery Left 06-16-13    Big toe and 2nd toe- tip removed -2 yrs ago, to clear infection  . Hardware removal Left 06/19/2013    Procedure: HARDWARE REMOVAL LEFT KNEE ;  Surgeon: [name], MD;  Location: [facility];  Service: Orthopedics;  Laterality: Left;  . Left foot surgery  2012    tip end of 2nd toe removed  . Total knee arthroplasty Left 08/07/2013    Procedure: LEFT TOTAL KNEE ARTHROPLASTY WITH REVISION COMPONENT ;  Surgeon: [name], MD;  Location: [facility];  Service: Orthopedics;  Laterality: Left;    Social History:  reports that she has never  smoked. She has never used smokeless tobacco. She reports that she does not drink alcohol or use illicit drugs.  Allergies:  Allergies  Allergen Reactions  . Shrimp [Shellfish Allergy] Anaphylaxis    Throat swells hives  . Doxycycline Nausea And Vomiting  . Prednisone Other (See Comments)    Patient states that she can take this medication now    Medications: No current facility-administered medications for this encounter.   Current Outpatient Prescriptions  Medication Sig Dispense Refill  . alendronate (FOSAMAX) 70 MG tablet Take 70 mg by mouth once a week. Take with  a full glass of water on an empty stomach.      . Calcium Carb-Cholecalciferol (CALCIUM 600 + D PO) Take 1 tablet by mouth 3 (three) times daily.      . cephALEXin (KEFLEX) 500 MG capsule Take 500 mg by mouth 4 (four) times daily.      Marland Kitchen. glimepiride (AMARYL) 2 MG tablet Take 2 mg by mouth daily with breakfast.        Review of Systems  Constitutional: Negative.   HENT: Negative.   Eyes: Negative.   Respiratory: Negative.   Cardiovascular: Negative.   Gastrointestinal: Negative.   Genitourinary: Negative.   Musculoskeletal: Positive for joint pain.  Skin: Negative.   Neurological: Negative.   Endo/Heme/Allergies: Negative.   Psychiatric/Behavioral: Negative.       Physical Exam  Constitutional: She is oriented to person, place, and time. She appears well-developed and well-nourished.  HENT:  Head: Normocephalic and atraumatic.  Mouth/Throat: Oropharynx is clear and moist.  Eyes: Pupils are equal, round, and reactive to light.  Neck: Neck supple. No JVD present. No tracheal deviation present. No thyromegaly present.  Cardiovascular: Normal rate, regular rhythm and intact distal pulses.   Respiratory: Effort normal and breath sounds normal. No respiratory distress. She has no wheezes.  GI: Soft. There is no tenderness. There is no guarding.  Musculoskeletal:       Left knee: She exhibits decreased range of motion, swelling and laceration (wound dehiscence with little drainage). Tenderness found.  Lymphadenopathy:    She has no cervical adenopathy.  Neurological: She is alert and oriented to person, place, and time.  Skin: Skin is warm and dry.  Psychiatric: She has a normal mood and affect.       Assessment/Plan Assessment:    Left knee wound dehiscence   Plan: Patient will undergo a left knee incisional debridement on 10/13/2013 [facility]. Risks benefits and expectations were discussed with the patient. Patient understand risks, benefits and  expectations and wishes to proceed.   Anastasio AuerbachMatthew S. Vayden Weinand   PAC  10/13/2013, 9:08 AM

## 2013-10-14 LAB — GLUCOSE, CAPILLARY
Glucose-Capillary: 181 mg/dL — ABNORMAL HIGH (ref 70–99)
Glucose-Capillary: 230 mg/dL — ABNORMAL HIGH (ref 70–99)
Glucose-Capillary: 167 mg/dL — ABNORMAL HIGH (ref 70–99)
Glucose-Capillary: 189 mg/dL — ABNORMAL HIGH (ref 70–99)

## 2013-10-14 LAB — BASIC METABOLIC PANEL
BUN: 9 mg/dL (ref 6–23)
CALCIUM: 8.2 mg/dL — AB (ref 8.4–10.5)
CO2: 29 mEq/L (ref 19–32)
Chloride: 101 mEq/L (ref 96–112)
Creatinine, Ser: 0.43 mg/dL — ABNORMAL LOW (ref 0.50–1.10)
GFR calc Af Amer: >90 mL/min (ref >90)
Glucose, Bld: 232 mg/dL — ABNORMAL HIGH (ref 70–99)
POTASSIUM: 4.2 meq/L (ref 3.7–5.3)
SODIUM: 136 meq/L — AB (ref 137–147)

## 2013-10-14 LAB — CBC
HCT: 31.8 % — ABNORMAL LOW (ref 36.0–46.0)
Hemoglobin: 10.6 g/dL — ABNORMAL LOW (ref 12.0–15.0)
MCH: 28.6 pg (ref 26.0–34.0)
MCHC: 33.3 g/dL (ref 30.0–36.0)
MCV: 85.7 fL (ref 78.0–100.0)
Platelets: 263 10*3/uL (ref 150–400)
RBC: 3.71 MIL/uL — ABNORMAL LOW (ref 3.87–5.11)
RDW: 13.1 % (ref 11.5–15.5)
WBC: 6.5 10*3/uL (ref 4.0–10.5)

## 2013-10-14 NOTE — Progress Notes (Signed)
Subjective: 1 Day Post-Op Procedure(s) (LRB): INCISIONAL DEBRIDEMENT LEFT KNEE (Left) Pt doing well this AM, pain well controlled, progressing with physical therapy, able to transfer to chair and toilet. Pt denies N/V/F/C, chest pain, SOB, calf pain, or paresthesia b/l.   Objective: Vital signs in last 24 hours: Temp:  [97.2 F (36.2 C)-97.9 F (36.6 C)] 97.4 F (36.3 C) (03/07 0525) Pulse Rate:  [81-102] 81 (03/07 0525) Resp:  [16-17] 16 (03/07 0525) BP: (101-161)/(60-79) 101/60 mmHg (03/07 0525) SpO2:  [98 %-100 %] 98 % (03/07 0525) Weight:  [90.379 kg (199 lb 4 oz)] 90.379 kg (199 lb 4 oz) (03/06 1015)  Intake/Output from previous day: 03/06 0701 - 03/07 0700 In: 3213 [I.V.:2613; IV Piggyback:600] Out: 900 [Urine:900] Intake/Output this shift:     Recent Labs  10/13/13 1045 10/14/13 0503  HGB 12.3 10.6*    Recent Labs  10/13/13 1045 10/14/13 0503  WBC 7.2 6.5  RBC 4.42 3.71*  HCT 37.3 31.8*  PLT 311 263    Recent Labs  10/13/13 1045 10/14/13 0503  NA 138 136*  K 4.2 4.2  CL 98 101  CO2 28 29  BUN 12 9  CREATININE 0.48* 0.43*  GLUCOSE 207* 232*  CALCIUM 9.3 8.2*    Recent Labs  10/13/13 1045  INR 0.94    WD WN female in NAD, A/Ox3, appears stated age.  EOMI, mood and affect normal, respirations unlabored.  On physical exam dressing is C/D/I, in proper placement and good repair.  Compartment soft, negative Homan's sign.  Lower extremities are NVI with good motor control of plantar/dorsiflexion of the great toes and feet b/l.  DP pulses 2+ b/l.    Assessment/Plan: 1 Day Post-Op Procedure(s) (LRB): INCISIONAL DEBRIDEMENT LEFT KNEE (Left) Up with therapy Drain pulled today with no complications, pt tolerated well Continue IV antibiotics Continue Aspirin for anticoagulation  Ginevra Tacker S 10/14/2013, 9:27 AM

## 2013-10-14 NOTE — Progress Notes (Addendum)
   CARE MANAGEMENT NOTE 10/14/2013  Patient:  Joella PrinceCALLICUTT,Shyane   Account Number:  [id number]  Date Initiated:  10/14/2013  Documentation initiated by:  [facility] Centers[facility]AVIS,Taren Dymek  Subjective/Objective Assessment:   INCISIONAL DEBRIDEMENT LEFT KNEE (Left)     Action/Plan:   HH   Anticipated DC Date:  10/15/2013   Anticipated DC Plan:  HOME W HOME HEALTH SERVICES      DC Planning Services  CM consult      [facility]   Choice offered to / List presented to:  C-1 Patient        HH arranged  HH-2 PT  HH[facility]1 RN      [facility]  [facility]   Status of service:  Completed, signed off Medicare Important Message given?   (If response is "NO", the following Medicare IM given date fields will be blank) Date Medicare IM given:   Date Additional Medicare IM given:    Discharge Disposition:  HOME W HOME HEALTH SERVICES  Per UR Regulation:    If discussed at Long Length of Stay Meetings, dates discussed:    Comments:  10/14/2013 1620 NCM spoke to pt and [facility] States she has RW and 3n1 at home. Isidoro DonningAlesia Perian Tedder RN CCM Case Mgmt phone [phone]

## 2013-10-14 NOTE — Op Note (Signed)
NAMEAMARISA, WILINSKI             ACCOUNT NO.:  [id number]  MEDICAL RECORD NO.:  [id number]  LOCATION:  1614                         FACILITY:  [facility]  PHYSICIAN:  Madlyn Frankel. Charlann Boxer, M.D.  DATE OF BIRTH:  1951/03/21  DATE OF PROCEDURE:  10/13/2013 DATE OF DISCHARGE:                              OPERATIVE REPORT   PREOPERATIVE DIAGNOSIS:  Wound dehiscence, anterior aspect of the distal knee incision.  POSTOPERATIVE DIAGNOSES/FINDINGS: 1. Evidence for a distal wound hematoma related to dissections from 2     previous surgeries with wound breakdown and nonhealing wound. 2. No signs of infection, superficial or deep.  PROCEDURE:  Sharp excisional debridement of the left knee wound approximately 6 cm in length, including skin and subcutaneous tissue, with evacuation of old hematoma blood followed by irrigation with 4 L normal saline solution as well as 500 mL mixed with chlorhexidine 2%.  SURGEON:  Madlyn Frankel. Charlann Boxer, M.D.  ASSISTANT:  Lanney Gins, PA-C.  Note that Mr. Carmon Sails was present for the entirety of the case from preoperative position, perioperative management of extremity, facilitation of the case as well as primary wound closure facilitation.  ANESTHESIA:  General LMA.  SPECIMENS:  None.  DRAINS:  One medium Hemovac placement in the superficial area.  COMPLICATIONS:  None apparent.  TOURNIQUET TIME:  12 minutes at 300 mmHg.  INDICATION FOR PROCEDURE:  Mr. 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  I had scheduled her for removal of hardware with an intended 6-8 week period prior to knee replacement surgery based on the incision location to maximize wound healing prior to knee replacement.  She then underwent knee replacement surgery approximately 2 months ago. She had been doing very well with the  overall recovery from her knee surgery, however, presented to the office just this week with concern for her wound opening up.  She had no fevers, chills, night sweats, wound, or foul-smelling odor. At the time of my evaluation in the office, she was immediately set up for surgery to try to manage this for primary wound purposes.  Risks of an infection deep, it was neglected, concern for potential further wound issues were reviewed.  The necessity of the procedure discussed.  Consent was obtained for benefit of wound management.  PROCEDURE IN DETAIL:  The patient was brought to the operative theater. Once adequate anesthesia and preoperative antibiotics, including Ancef and a dose of vancomycin given after the tourniquet was up administered. She was positioned supine.  Thigh tourniquet was placed in the left lower extremity.  The left lower extremity was then prepped and draped in a sterile fashion.  Time-out was performed identifying the patient, planned procedure, and extremity.  I first debrided eschar from her knee.  Underneath this was fibrous type tissue as was in the 5-6 mm defect in the incision site.  I chose to incise the incision proximal and distal of this area to allow for me to elliptically debride this area of skin breakdown.  Underlying this area was evidence of an old hematoma with evidence of  the breakdown of the hematoma.  There were no signs of purulence.  Following sharp debridement with a scalpel of this area about 6 cm, I debrided the subcutaneous tissue, skin in this area.  Underlying fascia was intact.  Suture line present.  Given these findings, following this sharp debridement, I irrigated the wound with 3 L normal saline solution.  Following this, with a pulse lavage, I irrigated the wound and let it sit for 5 minutes with 500 mL combined with chlorhexidine.  Following this, I re-irrigated with 1 L normal saline solution pulse lavage.  Tourniquet was  let down to 12 minutes.  There was noted to be adequate bleeding in the soft tissues given hope for wound closure.  I decided to place a medium Hemovac drain in effort to decompress this area to prevent further hematoma formation.  I then reapproximated using 2-0 Vicryl beginning in both the proximal and distal ends to reapproximate the skin edges.  Other than a very small area of about 3 mm in this area of most central wound break down, the wound closed very easily without significant tension.  I then dressed the wound with Xeroform and a bulky wrap.  A Hemovac drain had a hard time maintaining charge due to the primary wound closure with a nylon suture.  However, I am hopeful that it will help to at least drain some blood from this area.  That in addition, the compressor wrap will hopefully decompress any potential hematoma formation and allowing for better wound healing tension.  She will be following the dressing the wound.  She is brought to the recovery room in stable condition.  I will check on her wound daily prior to discharge.  We will get her placed with a PICC line for IV antibiotics as prophylaxis only due to an open wound and repeat surgery in this settings for multiple procedures she has had currently on her knee.  Findings reviewed with family.  She will continue to be weightbearing as tolerated.  I will probably will limit the knee flexion exercises and activity of daily living until the wound has fully healed.     Madlyn FrankelMatthew D. Charlann Boxerlin, M.D.     MDO/MEDQ  D:  10/13/2013  T:  10/14/2013  Job:  [id number]

## 2013-10-14 NOTE — Evaluation (Signed)
Physical Therapy Evaluation Patient Details Name: Chelsea Thornton MRN: [id number] DOB: August 25, 1950 Today's Date: 10/14/2013 Time: [id number] PT Time Calculation (min): 26 min  PT Assessment / Plan / Recommendation History of Present Illness     Clinical Impression  Pt s/p I&D of distal wound presents with minimal mobilization deficits and is ambulating with cane and min guard assist.  Pt plans d/c home with follow up out patient PT to continue rehab.    PT Assessment  Patient needs continued PT services    Follow Up Recommendations  Outpatient PT    Does the patient have the potential to tolerate intense rehabilitation      Barriers to Discharge        Equipment Recommendations  None recommended by PT    Recommendations for Other Services     Frequency 7X/week    Precautions / Restrictions Precautions Precautions: Fall Restrictions Weight Bearing Restrictions: No   Pertinent Vitals/Pain Min discomfort.      Mobility  Bed Mobility Overal bed mobility: Modified Independent Transfers Overall transfer level: Needs assistance Equipment used: Rolling walker (2 wheeled) Transfers: Sit to/from Stand Sit to Stand: Supervision General transfer comment: cues for use of UEs Ambulation/Gait Ambulation/Gait assistance: Min guard;Supervision Ambulation Distance (Feet): 200 Feet Assistive device: Rolling walker (2 wheeled);Straight cane Gait Pattern/deviations: Step-to pattern;Step-through pattern;Shuffle;Antalgic    Exercises Total Joint Exercises Ankle Circles/Pumps: AROM;15 reps;Supine;Both   PT Diagnosis:    PT Problem List: Decreased mobility PT Treatment Interventions: DME instruction;Gait training;Functional mobility training;Therapeutic activities;Therapeutic exercise;Patient/family education     PT Goals(Current goals can be found in the care plan section) Acute Rehab PT Goals Patient Stated Goal: Get back to work and Out pt rehab PT Goal Formulation: With  patient Time For Goal Achievement: 10/21/13 Potential to Achieve Goals: Good  Visit Information  Last PT Received On: 10/14/13 Assistance Needed: +1       Prior Functioning  Home Living Family/patient expects to be discharged to:: Private residence Living Arrangements: Alone Available Help at Discharge: Family Type of Home: House Home Access: Level entry Home Layout: One level Home Equipment: Environmental consultantWalker - 2 wheels;Cane - single point;Bedside commode Prior Function Level of Independence: Independent with assistive device(s) Comments: cane outside, no AD inside Communication Communication: No difficulties    Cognition  Cognition Arousal/Alertness: Awake/alert Behavior During Therapy: WFL for tasks assessed/performed Overall Cognitive Status: Within Functional Limits for tasks assessed    Extremity/Trunk Assessment Upper Extremity Assessment Upper Extremity Assessment: Overall WFL for tasks assessed Lower Extremity Assessment Lower Extremity Assessment: LLE deficits/detail LLE Deficits / Details: ROM not fully tested pending clarification from Dr Charlann Boxerlin on any restricitions Cervical / Trunk Assessment Cervical / Trunk Assessment: Normal   Balance    End of Session PT - End of Session Equipment Utilized During Treatment: Gait belt Activity Tolerance: Patient tolerated treatment well Patient left: in chair;with call bell/phone within reach Nurse Communication: Mobility status  GP     [name] 10/14/2013, 1:03 PM

## 2013-10-14 NOTE — Progress Notes (Signed)
OT Cancellation Note  Patient Details Name: Joella Princeancy Bryand MRN: [id number] DOB: 07-06-1951   Cancelled Treatment:    Reason Eval/Treat Not Completed: PT screened, no needs identified, will sign off  Derris Millan 10/14/2013, 11:07 AM Marica OtterMaryellen Leahna Hewson, OTR/L [phone] 10/14/2013

## 2013-10-15 LAB — BASIC METABOLIC PANEL
BUN: 9 mg/dL (ref 6–23)
CALCIUM: 8 mg/dL — AB (ref 8.4–10.5)
CHLORIDE: 104 meq/L (ref 96–112)
CO2: 27 mEq/L (ref 19–32)
CREATININE: 0.43 mg/dL — AB (ref 0.50–1.10)
Glucose, Bld: 190 mg/dL — ABNORMAL HIGH (ref 70–99)
Potassium: 3.8 mEq/L (ref 3.7–5.3)
Sodium: 139 mEq/L (ref 137–147)

## 2013-10-15 LAB — CBC
HCT: 31.9 % — ABNORMAL LOW (ref 36.0–46.0)
Hemoglobin: 10.6 g/dL — ABNORMAL LOW (ref 12.0–15.0)
MCH: 28.6 pg (ref 26.0–34.0)
MCHC: 33.2 g/dL (ref 30.0–36.0)
MCV: 86 fL (ref 78.0–100.0)
Platelets: 262 10*3/uL (ref 150–400)
RBC: 3.71 MIL/uL — ABNORMAL LOW (ref 3.87–5.11)
RDW: 13.2 % (ref 11.5–15.5)
WBC: 5.6 10*3/uL (ref 4.0–10.5)

## 2013-10-15 LAB — GLUCOSE, CAPILLARY
Glucose-Capillary: 130 mg/dL — ABNORMAL HIGH (ref 70–99)
Glucose-Capillary: 197 mg/dL — ABNORMAL HIGH (ref 70–99)

## 2013-10-15 LAB — VANCOMYCIN, TROUGH: Vancomycin Tr: 19.9 ug/mL (ref 10.0–20.0)

## 2013-10-15 MED ORDER — RIFAMPIN 300 MG PO CAPS: 300.0000 mg | ORAL_CAPSULE | Freq: Two times a day (BID) | ORAL | Status: DC

## 2013-10-15 MED ORDER — VANCOMYCIN HCL IN DEXTROSE 1-5 GM/200ML-% IV SOLN: 1000.0000 mg | Freq: Three times a day (TID) | INTRAVENOUS | Status: DC

## 2013-10-15 NOTE — Progress Notes (Signed)
Physical Therapy Treatment Patient Details Name: Chelsea Thornton MRN: [id number] DOB: 12-03-1950 Today's Date: 10/15/2013 Time: [id number] PT Time Calculation (min): 63 min  PT Assessment / Plan / Recommendation  History of Present Illness Chelsea Thornton, 63 y.o. female, has a history of pain and functional disability in the left knee due to trauma, tibial plateau fracture in January 2014, and has failed non-surgical conservative treatments; s/p L TKA   PT Comments   Noted drainage on sheet  At feet. Found that callosed ulcers draining. RN notified and placed - silicone dressings to both heels.  Spent extra time reviewing  With pt  Her PT  Will consist of her mobility at home, no HHPT/OP per DR. Charlann Boxerlin. Per Dr. Charlann Boxerlin, OK for active ROM, just not excessive. Pt is to keep knee extended when at rest. Pt is to DC today.  Follow Up Recommendations  No PT follow up (per DR. Olin today) until OV     Does the patient have the potential to tolerate intense rehabilitation     Barriers to Discharge        Equipment Recommendations  None recommended by PT    Recommendations for Other Services    Frequency     Progress towards PT Goals Progress towards PT goals: Progressing toward goals  Plan Current plan remains appropriate    Precautions / Restrictions Precautions Precautions: Fall Precaution Comments: limite L knee flexion    Pertinent Vitals/Pain 0    Mobility  Bed Mobility Overal bed mobility: Modified Independent Transfers Equipment used: Straight cane Transfers: Sit to/from Stand Sit to Stand: Supervision Ambulation/Gait Ambulation/Gait assistance: Supervision Ambulation Distance (Feet): 100 Feet Assistive device: Straight cane Gait Pattern/deviations: Step-to pattern;Antalgic;Shuffle    Exercises Total Joint Exercises Ankle Circles/Pumps: AROM;15 reps;Supine;Both Quad Sets: AROM;Left;10 reps;Supine Straight Leg Raises: AROM;Left;10 reps;Supine   PT Diagnosis:    PT Problem  List:   PT Treatment Interventions:     PT Goals (current goals can now be found in the care plan section)    Visit Information  Last PT Received On: 10/15/13 Assistance Needed: +1 History of Present Illness: Chelsea Thornton, 63 y.o. female, has a history of pain and functional disability in the left knee due to trauma, tibial plateau fracture in January 2014, and has failed non-surgical conservative treatments; s/p L TKA    Subjective Data      Cognition  Cognition Arousal/Alertness: Awake/alert Behavior During Therapy: WFL for tasks assessed/performed    Balance     End of Session PT - End of Session Activity Tolerance: Patient tolerated treatment well Patient left: in chair;with call bell/phone within reach Nurse Communication: Mobility status (bilateral heel ulcers draining.)   GP     Rada HayHill, Khameron Gruenwald Elizabeth 10/15/2013, 3:51 PM

## 2013-10-15 NOTE — Progress Notes (Signed)
Discharged from floor via w/c, sister with pt. No changes in assessment. [name]   

## 2013-10-15 NOTE — Discharge Summary (Signed)
Physician Discharge Summary   Patient ID: Chelsea Thornton MRN: [id number] DOB/AGE: 62-Mar-1952 62 y.o.  Admit date: 10/13/2013 Discharge date: 10/15/2013  Admission Diagnoses:  Principal Problem:   Superficial postoperative wound infection   Discharge Diagnoses:  Same   Surgeries: Procedure(s): INCISIONAL DEBRIDEMENT LEFT KNEE on 10/13/2013   Consultants: none  Discharged Condition: Stable  Hospital Course: Chelsea Thornton is an 63 y.o. female who was admitted 10/13/2013 with a chief complaint of No chief complaint on file. , and found to have a diagnosis of Superficial postoperative wound infection.  They were brought to the operating room on 10/13/2013 and underwent the above named procedures.    The patient had an uncomplicated hospital course and was stable for discharge.  Recent vital signs:  Filed Vitals:   10/15/13 0549  BP: 138/69  Pulse: 71  Temp: 97.9 F (36.6 C)  Resp:     Recent laboratory studies:  Results for orders placed during the hospital encounter of 10/13/13  CBC      Result Value Ref Range   WBC 7.2  4.0 - 10.5 K/uL   RBC 4.42  3.87 - 5.11 MIL/uL   Hemoglobin 12.3  12.0 - 15.0 g/dL   HCT 74.2  59.5 - 63.8 %   MCV 84.4  78.0 - 100.0 fL   MCH 27.8  26.0 - 34.0 pg   MCHC 33.0  30.0 - 36.0 g/dL   RDW 75.6  43.3 - 29.5 %   Platelets 311  150 - 400 K/uL  BASIC METABOLIC PANEL      Result Value Ref Range   Sodium 138  137 - 147 mEq/L   Potassium 4.2  3.7 - 5.3 mEq/L   Chloride 98  96 - 112 mEq/L   CO2 28  19 - 32 mEq/L   Glucose, Bld 207 (*) 70 - 99 mg/dL   BUN 12  6 - 23 mg/dL   Creatinine, Ser 1.88 (*) 0.50 - 1.10 mg/dL   Calcium 9.3  8.4 - 41.6 mg/dL   GFR calc non Af Amer >90  >90 mL/min   GFR calc Af Amer >90  >90 mL/min  PROTIME-INR      Result Value Ref Range   Prothrombin Time 12.4  11.6 - 15.2 seconds   INR 0.94  0.00 - 1.49  APTT      Result Value Ref Range   aPTT 35  24 - 37 seconds  URINALYSIS, ROUTINE W REFLEX MICROSCOPIC   Result Value Ref Range   Color, Urine YELLOW  YELLOW   APPearance CLEAR  CLEAR   Specific Gravity, Urine 1.011  1.005 - 1.030   pH 7.0  5.0 - 8.0   Glucose, UA 250 (*) NEGATIVE mg/dL   Hgb urine dipstick NEGATIVE  NEGATIVE   Bilirubin Urine NEGATIVE  NEGATIVE   Ketones, ur NEGATIVE  NEGATIVE mg/dL   Protein, ur NEGATIVE  NEGATIVE mg/dL   Urobilinogen, UA 0.2  0.0 - 1.0 mg/dL   Nitrite NEGATIVE  NEGATIVE   Leukocytes, UA NEGATIVE  NEGATIVE  GLUCOSE, CAPILLARY      Result Value Ref Range   Glucose-Capillary 175 (*) 70 - 99 mg/dL  GLUCOSE, CAPILLARY      Result Value Ref Range   Glucose-Capillary 184 (*) 70 - 99 mg/dL  CBC      Result Value Ref Range   WBC 6.5  4.0 - 10.5 K/uL   RBC 3.71 (*) 3.87 - 5.11 MIL/uL   Hemoglobin 10.6 (*) 12.0 -  15.0 g/dL   HCT 40.931.8 (*) 81.136.0 - 91.446.0 %   MCV 85.7  78.0 - 100.0 fL   MCH 28.6  26.0 - 34.0 pg   MCHC 33.3  30.0 - 36.0 g/dL   RDW 78.213.1  95.611.5 - 21.315.5 %   Platelets 263  150 - 400 K/uL  BASIC METABOLIC PANEL      Result Value Ref Range   Sodium 136 (*) 137 - 147 mEq/L   Potassium 4.2  3.7 - 5.3 mEq/L   Chloride 101  96 - 112 mEq/L   CO2 29  19 - 32 mEq/L   Glucose, Bld 232 (*) 70 - 99 mg/dL   BUN 9  6 - 23 mg/dL   Creatinine, Ser 0.860.43 (*) 0.50 - 1.10 mg/dL   Calcium 8.2 (*) 8.4 - 10.5 mg/dL   GFR calc non Af Amer >90  >90 mL/min   GFR calc Af Amer >90  >90 mL/min  GLUCOSE, CAPILLARY      Result Value Ref Range   Glucose-Capillary 212 (*) 70 - 99 mg/dL  GLUCOSE, CAPILLARY      Result Value Ref Range   Glucose-Capillary 272 (*) 70 - 99 mg/dL  GLUCOSE, CAPILLARY      Result Value Ref Range   Glucose-Capillary 181 (*) 70 - 99 mg/dL  GLUCOSE, CAPILLARY      Result Value Ref Range   Glucose-Capillary 230 (*) 70 - 99 mg/dL  GLUCOSE, CAPILLARY      Result Value Ref Range   Glucose-Capillary 167 (*) 70 - 99 mg/dL  CBC      Result Value Ref Range   WBC 5.6  4.0 - 10.5 K/uL   RBC 3.71 (*) 3.87 - 5.11 MIL/uL   Hemoglobin 10.6 (*) 12.0 -  15.0 g/dL   HCT 57.831.9 (*) 46.936.0 - 62.946.0 %   MCV 86.0  78.0 - 100.0 fL   MCH 28.6  26.0 - 34.0 pg   MCHC 33.2  30.0 - 36.0 g/dL   RDW 52.813.2  41.311.5 - 24.415.5 %   Platelets 262  150 - 400 K/uL  BASIC METABOLIC PANEL      Result Value Ref Range   Sodium 139  137 - 147 mEq/L   Potassium 3.8  3.7 - 5.3 mEq/L   Chloride 104  96 - 112 mEq/L   CO2 27  19 - 32 mEq/L   Glucose, Bld 190 (*) 70 - 99 mg/dL   BUN 9  6 - 23 mg/dL   Creatinine, Ser 0.100.43 (*) 0.50 - 1.10 mg/dL   Calcium 8.0 (*) 8.4 - 10.5 mg/dL   GFR calc non Af Amer >90  >90 mL/min   GFR calc Af Amer >90  >90 mL/min  GLUCOSE, CAPILLARY      Result Value Ref Range   Glucose-Capillary 189 (*) 70 - 99 mg/dL  GLUCOSE, CAPILLARY      Result Value Ref Range   Glucose-Capillary 130 (*) 70 - 99 mg/dL  TYPE AND SCREEN      Result Value Ref Range   ABO/RH(D) O POS     Antibody Screen NEG     Sample Expiration 10/16/2013      Discharge Medications:     Medication List    ASK your doctor about these medications       alendronate 70 MG tablet  Commonly known as:  FOSAMAX  Take 70 mg by mouth once a week. Take with a full glass of water on an  empty stomach.     CALCIUM 600 + D PO  Take 1 tablet by mouth 3 (three) times daily.     cephALEXin 500 MG capsule  Commonly known as:  KEFLEX  Take 500 mg by mouth 4 (four) times daily.     glimepiride 2 MG tablet  Commonly known as:  AMARYL  Take 2 mg by mouth daily with breakfast.        Diagnostic Studies: No results found.  Disposition: 06-Home-Health Care Svc        Follow-up Information   Follow up with [facility]. [facility] and RN)    Contact information:   [address] [place] [address] [phone]        Signed: Thea Gist 10/15/2013, 8:05 AM

## 2013-10-15 NOTE — Progress Notes (Signed)
   Subjective: 2 Days Post-Op Procedure(s) (LRB): INCISIONAL DEBRIDEMENT LEFT KNEE (Left)  Pt doing very well No pain  Ready to go home since she has PICC line Overall doing well Patient reports pain as none.  Objective:   VITALS:   Filed Vitals:   10/15/13 0549  BP: 138/69  Pulse: 71  Temp: 97.9 F (36.6 C)  Resp:     Left knee incision healing well nv intact distally Dressing changed   LABS  Recent Labs  10/13/13 1045 10/14/13 0503 10/15/13 0330  HGB 12.3 10.6* 10.6*  HCT 37.3 31.8* 31.9*  WBC 7.2 6.5 5.6  PLT 311 263 262     Recent Labs  10/13/13 1045 10/14/13 0503 10/15/13 0330  NA 138 136* 139  K 4.2 4.2 3.8  BUN 12 9 9   CREATININE 0.48* 0.43* 0.43*  GLUCOSE 207* 232* 190*     Assessment/Plan: 2 Days Post-Op Procedure(s) (LRB): INCISIONAL DEBRIDEMENT LEFT KNEE (Left)  D/c home today Home IV antibiotics F/u in 1 week with Dr. Francia Greaveslin   Brad Brandey Vandalen, MPAS, PA-C  10/15/2013, 8:04 AM

## 2013-10-15 NOTE — Progress Notes (Signed)
ANTIBIOTIC CONSULT NOTE - Follow up  Pharmacy Consult for Vancomycin Indication: left knee wound infection  Allergies  Allergen Reactions  . Bee Venom Anaphylaxis  . Shrimp [Shellfish Allergy] Anaphylaxis    Throat swells hives  . Doxycycline Nausea And Vomiting  . Prednisone Other (See Comments)    Patient states that she can take this medication now    Patient Measurements: Height: 5\' 7"  (170.2 cm) Weight: 199 lb 4 oz (90.379 kg) IBW/kg (Calculated) : 61.6  Vital Signs: Temp: 97.9 F (36.6 C) (03/08 0549) Temp src: Oral (03/08 0549) BP: 138/69 mmHg (03/08 0549) Pulse Rate: 71 (03/08 0549) Intake/Output from previous day: 03/07 0701 - 03/08 0700 In: 2300 [P.O.:480; I.V.:1220; IV Piggyback:600] Out: 2150 [Urine:2150] Intake/Output from this shift: Total I/O In: 240 [P.O.:240] Out: -   Labs:  Recent Labs  10/13/13 1045 10/14/13 0503 10/15/13 0330  WBC 7.2 6.5 5.6  HGB 12.3 10.6* 10.6*  PLT 311 263 262  CREATININE 0.48* 0.43* 0.43*   Estimated Creatinine Clearance: 85.2 ml/min (by C-G formula based on Cr of 0.43).  Recent Labs  10/15/13 0930  VANCOTROUGH 19.9     Microbiology: No results found for this or any previous visit (from the past 720 hour(s)).  Assessment: [age] yoF presents with left knee pain with mild drainage.  Patient has history of prior procedures on the knee to include ORIF of tibial plateau fracture on the left knee as well as recent left total knee arthroplasty on 08/07/2013.  Now, s/p left knee incisional debridement today for left knee wound.  Hemovac drain in the left knee.  Patient received Cefazolin 2g pre-op and pharmacy has been consulted to begin vancomycin post-op.  3/6 >> Rifampin(MD) >> 3/6 >> Vanc >>  Tmax: AF WBC: wnl Renal: wnl, CrCl ~10084ml/min/1.73m2(rounding SCr to 0.8)  No cultures.  3/8: Vanc trough = 19.9 on 1g q8h.   Goal of Therapy:  Vancomycin trough level 15-20 mcg/ml  Plan:   Cont Vanc 1g IV q8h.    Cont Rifampin 300mg  PO q12h.  Consider rechecking trough soon since it was at the top of the target range.  Follow up renal fxn and culture results.  Charolotte Ekeom Jodeci Rini, PharmD, pager [phone]. 10/15/2013,1:47 PM.

## 2013-10-15 NOTE — Discharge Instructions (Signed)
Keep heels off bed at all times  Keep legs elevated to keep pressure off heels and give leg wound best chance to heal  RTC Wednesday or Thursday  Not able to return to work until further directed by Dr. Charlann BoxerLIN

## 2013-10-15 NOTE — Progress Notes (Addendum)
   CARE MANAGEMENT NOTE 10/15/2013  Patient:  Chelsea Thornton,Chelsea Thornton   Account Number:  [id number]  Date Initiated:  10/14/2013  Documentation initiated by:  [facility]AV[facility],Jaziya Obarr  Subjective/Objective Assessment:   INCISIONAL DEBRIDEMENT LEFT KNEE (Left)     Action/Plan:   HH   Anticipated DC Date:  10/15/2013   Anticipated DC Plan:  HOME W HOME HEALTH SERVICES      DC Planning Services  CM consult      [facility]   Choice offered to / List presented to:  C-1 Patient        HH arranged  HH-2 PT  HH-1 RN  IV Antibiotics      HH agency  [facility]   Status of service:  Completed, signed off Medicare Important Message given?   (If response is "NO", the following Medicare IM given date fields will be blank) Date Medicare IM given:   Date Additional Medicare IM given:    Discharge Disposition:  HOME W HOME HEALTH SERVICES  Per UR Regulation:    If discussed at Long Length of Stay Meetings, dates discussed:    Comments:  10/15/2013 1:43 PM  [facility] Pt needs IV abx for home. Pt [facility]able [facility]. Pt states she is not staying another night in hospital. Pt upset that she does not have anyone at home to assist her in getting medication once NCM explained how [facility] come out. Her sister has to leave flying back home. Sister contacted her daughter to see if she could assist with giving IV abx at home. She would come out this evening to train on administering IV abx. Contacted [facility] and IV abx Rx faxed to pharmacy. Scheduled dose due this evening, scheduled to have bid. NCM contacted Standley Dakinshomas Dixon PA to clarify dose, he will contact [facility], [phone] to clarify IV abx dosing.  Isidoro DonningAlesia Dorean Hiebert RN CCM Case Mgmt phone [phone]   10/14/2013 1620 NCM spoke to pt and offered [facility]. States she has RW and 3n1 at home. Isidoro DonningAlesia Adyson Vanburen RN CCM Case Mgmt phone  [phone]

## 2013-10-15 NOTE — Plan of Care (Signed)
Problem: Discharge Progression Outcomes Goal: Barriers To Progression Addressed/Resolved Outcome: Completed/Met Date Met:  10/15/13 Home health issues taken care of. Goal: Complications resolved/controlled Outcome: Completed/Met Date Met:  10/15/13 Home health issues

## 2013-10-16 ENCOUNTER — Encounter (HOSPITAL_COMMUNITY): Payer: Self-pay | Admitting: Orthopedic Surgery

## 2013-11-05 ENCOUNTER — Inpatient Hospital Stay (HOSPITAL_COMMUNITY)
Admission: EM | Admit: 2013-11-05 | Discharge: 2013-11-07 | DRG: 920 | Disposition: A | Discharge status: Home / Self Care | Payer: Commercial Managed Care - PPO | Attending: Orthopedic Surgery | Admitting: Orthopedic Surgery

## 2013-11-05 ENCOUNTER — Encounter (HOSPITAL_COMMUNITY): Payer: Self-pay | Admitting: Emergency Medicine

## 2013-11-05 ENCOUNTER — Emergency Department (HOSPITAL_COMMUNITY): Payer: Commercial Managed Care - PPO

## 2013-11-05 DIAGNOSIS — Y838 Other surgical procedures as the cause of abnormal reaction of the patient, or of later complication, without mention of misadventure at the time of the procedure: Secondary | ICD-10-CM | POA: Diagnosis present

## 2013-11-05 DIAGNOSIS — S8990XA Unspecified injury of unspecified lower leg, initial encounter: Secondary | ICD-10-CM

## 2013-11-05 DIAGNOSIS — T8133XA Disruption of traumatic injury wound repair, initial encounter: Secondary | ICD-10-CM | POA: Diagnosis present

## 2013-11-05 DIAGNOSIS — Z96659 Presence of unspecified artificial knee joint: Secondary | ICD-10-CM

## 2013-11-05 DIAGNOSIS — T8131XA Disruption of external operation (surgical) wound, not elsewhere classified, initial encounter: Principal | ICD-10-CM | POA: Diagnosis present

## 2013-11-05 DIAGNOSIS — Z6831 Body mass index (BMI) 31.0-31.9, adult: Secondary | ICD-10-CM

## 2013-11-05 DIAGNOSIS — I252 Old myocardial infarction: Secondary | ICD-10-CM

## 2013-11-05 DIAGNOSIS — Y9229 Other specified public building as the place of occurrence of the external cause: Secondary | ICD-10-CM

## 2013-11-05 DIAGNOSIS — T8130XA Disruption of wound, unspecified, initial encounter: Secondary | ICD-10-CM

## 2013-11-05 DIAGNOSIS — D62 Acute posthemorrhagic anemia: Secondary | ICD-10-CM | POA: Diagnosis present

## 2013-11-05 DIAGNOSIS — E669 Obesity, unspecified: Secondary | ICD-10-CM | POA: Diagnosis present

## 2013-11-05 DIAGNOSIS — Z8249 Family history of ischemic heart disease and other diseases of the circulatory system: Secondary | ICD-10-CM

## 2013-11-05 DIAGNOSIS — Z833 Family history of diabetes mellitus: Secondary | ICD-10-CM

## 2013-11-05 DIAGNOSIS — M25569 Pain in unspecified knee: Secondary | ICD-10-CM | POA: Diagnosis present

## 2013-11-05 DIAGNOSIS — E119 Type 2 diabetes mellitus without complications: Secondary | ICD-10-CM | POA: Diagnosis present

## 2013-11-05 DIAGNOSIS — E785 Hyperlipidemia, unspecified: Secondary | ICD-10-CM | POA: Diagnosis present

## 2013-11-05 DIAGNOSIS — W010XXA Fall on same level from slipping, tripping and stumbling without subsequent striking against object, initial encounter: Secondary | ICD-10-CM | POA: Diagnosis present

## 2013-11-05 LAB — CBC WITH DIFFERENTIAL/PLATELET
BASOS PCT: 1 % (ref 0–1)
Basophils Absolute: 0.1 10*3/uL (ref 0.0–0.1)
EOS ABS: 0.1 10*3/uL (ref 0.0–0.7)
EOS PCT: 1 % (ref 0–5)
HCT: 36 % (ref 36.0–46.0)
Hemoglobin: 12.3 g/dL (ref 12.0–15.0)
Lymphocytes Relative: 12 % (ref 12–46)
Lymphs Abs: 1.1 10*3/uL (ref 0.7–4.0)
MCH: 28.7 pg (ref 26.0–34.0)
MCHC: 34.2 g/dL (ref 30.0–36.0)
MCV: 83.9 fL (ref 78.0–100.0)
Monocytes Absolute: 0.9 10*3/uL (ref 0.1–1.0)
Monocytes Relative: 9 % (ref 3–12)
Neutro Abs: 7 10*3/uL (ref 1.7–7.7)
Neutrophils Relative %: 76 % (ref 43–77)
PLATELETS: 267 10*3/uL (ref 150–400)
RBC: 4.29 MIL/uL (ref 3.87–5.11)
RDW: 12.9 % (ref 11.5–15.5)
WBC: 9.2 10*3/uL (ref 4.0–10.5)

## 2013-11-05 LAB — BASIC METABOLIC PANEL
BUN: 17 mg/dL (ref 6–23)
CALCIUM: 9.3 mg/dL (ref 8.4–10.5)
CO2: 26 mEq/L (ref 19–32)
Chloride: 95 mEq/L — ABNORMAL LOW (ref 96–112)
Creatinine, Ser: 0.58 mg/dL (ref 0.50–1.10)
GLUCOSE: 487 mg/dL — AB (ref 70–99)
POTASSIUM: 4.7 meq/L (ref 3.7–5.3)
Sodium: 134 mEq/L — ABNORMAL LOW (ref 137–147)

## 2013-11-05 MED ORDER — SODIUM CHLORIDE 0.9 % IJ SOLN: 3.0000 mL | Freq: Two times a day (BID) | INTRAMUSCULAR | Status: DC

## 2013-11-05 MED ORDER — SODIUM CHLORIDE 0.9 % IV SOLN
INTRAVENOUS | Status: DC
  Administered 2013-11-05 – 2013-11-06 (×2): via INTRAVENOUS

## 2013-11-05 MED ORDER — HYDROMORPHONE HCL PF 1 MG/ML IJ SOLN
1.0000 mg | INTRAMUSCULAR | Status: DC | PRN
  Administered 2013-11-05: 1 mg via INTRAVENOUS
  Filled 2013-11-05: qty 1

## 2013-11-05 MED ORDER — VANCOMYCIN HCL IN DEXTROSE 1-5 GM/200ML-% IV SOLN
1000.0000 mg | Freq: Three times a day (TID) | INTRAVENOUS | Status: DC
  Administered 2013-11-05 – 2013-11-07 (×5): 1000 mg via INTRAVENOUS
  Filled 2013-11-05 (×7): qty 200

## 2013-11-05 MED ORDER — FLEET ENEMA 7-19 GM/118ML RE ENEM: 1.0000 | ENEMA | Freq: Once | RECTAL | Status: AC | PRN

## 2013-11-05 MED ORDER — CEFAZOLIN SODIUM-DEXTROSE 2-3 GM-% IV SOLR: 2.0000 g | INTRAVENOUS | Status: DC

## 2013-11-05 MED ORDER — SODIUM CHLORIDE 0.9 % IJ SOLN: 3.0000 mL | INTRAMUSCULAR | Status: DC | PRN

## 2013-11-05 MED ORDER — HYDROCODONE-ACETAMINOPHEN 5-325 MG PO TABS: 1.0000 | ORAL_TABLET | ORAL | Status: DC | PRN

## 2013-11-05 MED ORDER — ONDANSETRON HCL 4 MG/2ML IJ SOLN
4.0000 mg | Freq: Four times a day (QID) | INTRAMUSCULAR | Status: DC | PRN
  Administered 2013-11-05 – 2013-11-06 (×2): 4 mg via INTRAVENOUS
  Filled 2013-11-05 (×2): qty 2

## 2013-11-05 MED ORDER — GLIMEPIRIDE 2 MG PO TABS
2.0000 mg | ORAL_TABLET | Freq: Every day | ORAL | Status: DC
  Administered 2013-11-06 – 2013-11-07 (×2): 2 mg via ORAL
  Filled 2013-11-05 (×4): qty 1

## 2013-11-05 MED ORDER — ASPIRIN EC 325 MG PO TBEC
325.0000 mg | DELAYED_RELEASE_TABLET | Freq: Two times a day (BID) | ORAL | Status: DC
  Administered 2013-11-05 – 2013-11-07 (×4): 325 mg via ORAL
  Filled 2013-11-05 (×7): qty 1

## 2013-11-05 MED ORDER — ACETAMINOPHEN 325 MG PO TABS: 650.0000 mg | ORAL_TABLET | Freq: Four times a day (QID) | ORAL | Status: DC | PRN

## 2013-11-05 MED ORDER — POLYETHYLENE GLYCOL 3350 17 G PO PACK
17.0000 g | PACK | Freq: Every day | ORAL | Status: DC | PRN
  Filled 2013-11-05: qty 1

## 2013-11-05 MED ORDER — CALCIUM CARB-CHOLECALCIFEROL 600-200 MG-UNIT PO TABS: 600.0000 mg | ORAL_TABLET | Freq: Three times a day (TID) | ORAL | Status: DC

## 2013-11-05 MED ORDER — ACETAMINOPHEN 650 MG RE SUPP: 650.0000 mg | Freq: Four times a day (QID) | RECTAL | Status: DC | PRN

## 2013-11-05 MED ORDER — CEFAZOLIN SODIUM-DEXTROSE 2-3 GM-% IV SOLR
2.0000 g | Freq: Once | INTRAVENOUS | Status: AC
  Administered 2013-11-05: 2 g via INTRAVENOUS
  Filled 2013-11-05: qty 50

## 2013-11-05 MED ORDER — TRAMADOL HCL 50 MG PO TABS
50.0000 mg | ORAL_TABLET | Freq: Four times a day (QID) | ORAL | Status: DC
  Administered 2013-11-06 – 2013-11-07 (×5): 50 mg via ORAL
  Filled 2013-11-05 (×5): qty 1

## 2013-11-05 MED ORDER — ONDANSETRON HCL 4 MG PO TABS: 4.0000 mg | ORAL_TABLET | Freq: Four times a day (QID) | ORAL | Status: DC | PRN

## 2013-11-05 MED ORDER — SODIUM CHLORIDE 0.9 % IV SOLN: 250.0000 mL | INTRAVENOUS | Status: DC | PRN

## 2013-11-05 MED ORDER — RIFAMPIN 300 MG PO CAPS
300.0000 mg | ORAL_CAPSULE | Freq: Two times a day (BID) | ORAL | Status: DC
  Administered 2013-11-05 – 2013-11-07 (×4): 300 mg via ORAL
  Filled 2013-11-05 (×5): qty 1

## 2013-11-05 MED ORDER — DOCUSATE SODIUM 100 MG PO CAPS
100.0000 mg | ORAL_CAPSULE | Freq: Two times a day (BID) | ORAL | Status: DC
  Filled 2013-11-05 (×5): qty 1

## 2013-11-05 MED ORDER — ALENDRONATE SODIUM 70 MG PO TABS: 70.0000 mg | ORAL_TABLET | ORAL | Status: DC

## 2013-11-05 MED ORDER — CALCIUM CARBONATE-VITAMIN D 500-200 MG-UNIT PO TABS
1.0000 | ORAL_TABLET | Freq: Three times a day (TID) | ORAL | Status: DC
  Administered 2013-11-05 – 2013-11-07 (×4): 1 via ORAL
  Filled 2013-11-05 (×7): qty 1

## 2013-11-05 MED ORDER — ALUM & MAG HYDROXIDE-SIMETH 200-200-20 MG/5ML PO SUSP: 30.0000 mL | Freq: Four times a day (QID) | ORAL | Status: DC | PRN

## 2013-11-05 MED ORDER — BISACODYL 10 MG RE SUPP: 10.0000 mg | Freq: Every day | RECTAL | Status: DC | PRN

## 2013-11-05 NOTE — ED Notes (Addendum)
Pt here via [facility] c/o left knee injury from a fall today at a restaurant where she tripped over a rug. Pt has open up incision line that has appeared to be healed from a left knee replacement recently. Bleeding controlled at this time. PT unsure if she takes blood thinners.

## 2013-11-05 NOTE — ED Provider Notes (Signed)
Medical screening examination/treatment/procedure(s) were conducted as a shared visit with non-physician practitioner(s) and myself.  I personally evaluated the patient during the encounter.  Mechanical fall onto left knee with wound dehiscence. Did not hit head or lose consciousness. No focal weakness, numbness or tingling   EKG Interpretation None        Glynn OctaveStephen Lateefa Crosby, MD 11/05/13 [phone]

## 2013-11-05 NOTE — ED Notes (Signed)
Bed: WA06 Expected date: 11/05/13 Expected time: 3:12 PM Means of arrival: Ambulance Comments: Fall, knee injury

## 2013-11-05 NOTE — ED Notes (Signed)
PA at bedside.

## 2013-11-05 NOTE — ED Provider Notes (Signed)
CSN: [id number]     Arrival date & time 11/05/13  1511 History   First MD Initiated Contact with Patient 11/05/13 1513     Chief Complaint  Patient presents with  . Knee Injury     (Consider location/radiation/quality/duration/timing/severity/associated sxs/prior Treatment) HPI Comments: Patient presents to the ED with a chief complaint of fall.  She states that she tripped on a rug at a restaurant and fell hitting her left knee.  She is s/p 3 weeks TKA.  She states that she re-opened the incision.  She states that her pain is moderate.  Bleeding is controlled.  The surgery was performed by Dr. Charlann Boxerlin.  The patient's states that she spoke to Dr. Juliene PinaGeoffrey, who request to be notified upon patient's arrival.  The history is provided by the patient. No language interpreter was used.    Past Medical History  Diagnosis Date  . Diabetes mellitus   . Hyperlipidemia   . Abnormal CXR 09/24/2012    Dr. Oneita Hurtlance"looks like little clouds"-was told is improved.Denies SOB or trouble breathing  . Heart attack     '12-"dizzy spells"  . Pneumonia yrs ago  . Complication of anesthesia 06-19-13 left shoulder and arm hurt upon awakening and for several days  . PONV (postoperative nausea and vomiting)    Past Surgical History  Procedure Laterality Date  . Total knee arthroplasty  2007    Right  . Video bronchoscopy  11/03/2011    Procedure: VIDEO BRONCHOSCOPY WITHOUT FLUORO;  Surgeon: Barbaraann ShareKeith M Clance, MD;  [facility];  Service: Cardiopulmonary;  Laterality: Bilateral;  . Orif tibia plateau Left 09/25/2012    Procedure: OPEN REDUCTION INTERNAL FIXATION (ORIF) TIBIAL PLATEAU;  Surgeon: Eugenia Mcalpineobert Collins, MD;  Location: [facility];  Service: Orthopedics;  Laterality: Left;  . Cardiac catheterization      '12- stent placed-[facility]  . Toe surgery Left 06-16-13    Big toe and 2nd toe- tip removed -2 yrs ago, to clear infection  . Hardware removal Left 06/19/2013    Procedure: HARDWARE REMOVAL LEFT  KNEE ;  Surgeon: Shelda PalMatthew D Olin, MD;  Location: [facility];  Service: Orthopedics;  Laterality: Left;  . Left foot surgery  2012    tip end of 2nd toe removed  . Total knee arthroplasty Left 08/07/2013    Procedure: LEFT TOTAL KNEE ARTHROPLASTY WITH REVISION COMPONENT ;  Surgeon: Shelda PalMatthew D Olin, MD;  Location: [facility];  Service: Orthopedics;  Laterality: Left;  . Irrigation and debridement knee Left 10/13/2013    Procedure: INCISIONAL DEBRIDEMENT LEFT KNEE;  Surgeon: Shelda PalMatthew D Olin, MD;  Location: [facility];  Service: Orthopedics;  Laterality: Left;   Family History  Problem Relation Age of Onset  . Heart attack Mother   . Diabetes Mother   . Cancer - Colon Father    History  Substance Use Topics  . Smoking status: Never Smoker   . Smokeless tobacco: Never Used  . Alcohol Use: No   OB History   Grav Para Term Preterm Abortions TAB SAB Ect Mult Living                 Review of Systems  Constitutional: Negative for fever and chills.  Respiratory: Negative for shortness of breath.   Cardiovascular: Negative for chest pain.  Gastrointestinal: Negative for nausea, vomiting, diarrhea and constipation.  Genitourinary: Negative for dysuria.  Skin: Positive for wound.      Allergies  Bee venom; Shrimp; Doxycycline; and Prednisone  Home Medications  Current Outpatient Rx  Name  Route  Sig  Dispense  Refill  . alendronate (FOSAMAX) 70 MG tablet   Oral   Take 70 mg by mouth once a week. Take with a full glass of water on an empty stomach.         . Calcium Carb-Cholecalciferol (CALCIUM 600 + D PO)   Oral   Take 1 tablet by mouth 3 (three) times daily.         Marland Kitchen glimepiride (AMARYL) 2 MG tablet   Oral   Take 2 mg by mouth daily with breakfast.         . rifampin (RIFADIN) 300 MG capsule   Oral   Take 1 capsule (300 mg total) by mouth every 12 (twelve) hours.   60 capsule   0   . vancomycin (VANCOCIN) 1 GM/200ML SOLN   Intravenous   Inject 200 mLs (1,000 mg total) into  the vein every 8 (eight) hours.   4000 mL   0    BP 171/63  Pulse 102  Temp(Src) 97.8 F (36.6 C) (Oral)  Resp 16  SpO2 98% Physical Exam  Nursing note and vitals reviewed. Constitutional: She is oriented to person, place, and time. She appears well-developed and well-nourished.  HENT:  Head: Normocephalic and atraumatic.  Eyes: Conjunctivae and EOM are normal.  Neck: Normal range of motion.  Cardiovascular: Normal rate.   Pulmonary/Chest: Effort normal.  Abdominal: She exhibits no distension.  Musculoskeletal: Normal range of motion.  Neurological: She is alert and oriented to person, place, and time.  Skin: Skin is dry.  3x8cm wound dehiscence of the left knee from prior TKA, bleeding is controlled  Psychiatric: She has a normal mood and affect. Her behavior is normal. Judgment and thought content normal.    ED Course  Procedures (including critical care time) Results for orders placed during the hospital encounter of 10/13/13  CBC      Result Value Ref Range   WBC 7.2  4.0 - 10.5 K/uL   RBC 4.42  3.87 - 5.11 MIL/uL   Hemoglobin 12.3  12.0 - 15.0 g/dL   HCT 16.1  09.6 - 04.5 %   MCV 84.4  78.0 - 100.0 fL   MCH 27.8  26.0 - 34.0 pg   MCHC 33.0  30.0 - 36.0 g/dL   RDW 40.9  81.1 - 91.4 %   Platelets 311  150 - 400 K/uL  BASIC METABOLIC PANEL      Result Value Ref Range   Sodium 138  137 - 147 mEq/L   Potassium 4.2  3.7 - 5.3 mEq/L   Chloride 98  96 - 112 mEq/L   CO2 28  19 - 32 mEq/L   Glucose, Bld 207 (*) 70 - 99 mg/dL   BUN 12  6 - 23 mg/dL   Creatinine, Ser 7.82 (*) 0.50 - 1.10 mg/dL   Calcium 9.3  8.4 - 95.6 mg/dL   GFR calc non Af Amer >90  >90 mL/min   GFR calc Af Amer >90  >90 mL/min  PROTIME-INR      Result Value Ref Range   Prothrombin Time 12.4  11.6 - 15.2 seconds   INR 0.94  0.00 - 1.49  APTT      Result Value Ref Range   aPTT 35  24 - 37 seconds  URINALYSIS, ROUTINE W REFLEX MICROSCOPIC      Result Value Ref Range   Color, Urine YELLOW   YELLOW  APPearance CLEAR  CLEAR   Specific Gravity, Urine 1.011  1.005 - 1.030   pH 7.0  5.0 - 8.0   Glucose, UA 250 (*) NEGATIVE mg/dL   Hgb urine dipstick NEGATIVE  NEGATIVE   Bilirubin Urine NEGATIVE  NEGATIVE   Ketones, ur NEGATIVE  NEGATIVE mg/dL   Protein, ur NEGATIVE  NEGATIVE mg/dL   Urobilinogen, UA 0.2  0.0 - 1.0 mg/dL   Nitrite NEGATIVE  NEGATIVE   Leukocytes, UA NEGATIVE  NEGATIVE  GLUCOSE, CAPILLARY      Result Value Ref Range   Glucose-Capillary 175 (*) 70 - 99 mg/dL  GLUCOSE, CAPILLARY      Result Value Ref Range   Glucose-Capillary 184 (*) 70 - 99 mg/dL  CBC      Result Value Ref Range   WBC 6.5  4.0 - 10.5 K/uL   RBC 3.71 (*) 3.87 - 5.11 MIL/uL   Hemoglobin 10.6 (*) 12.0 - 15.0 g/dL   HCT 40.9 (*) 81.1 - 91.4 %   MCV 85.7  78.0 - 100.0 fL   MCH 28.6  26.0 - 34.0 pg   MCHC 33.3  30.0 - 36.0 g/dL   RDW 78.2  95.6 - 21.3 %   Platelets 263  150 - 400 K/uL  BASIC METABOLIC PANEL      Result Value Ref Range   Sodium 136 (*) 137 - 147 mEq/L   Potassium 4.2  3.7 - 5.3 mEq/L   Chloride 101  96 - 112 mEq/L   CO2 29  19 - 32 mEq/L   Glucose, Bld 232 (*) 70 - 99 mg/dL   BUN 9  6 - 23 mg/dL   Creatinine, Ser 0.86 (*) 0.50 - 1.10 mg/dL   Calcium 8.2 (*) 8.4 - 10.5 mg/dL   GFR calc non Af Amer >90  >90 mL/min   GFR calc Af Amer >90  >90 mL/min  GLUCOSE, CAPILLARY      Result Value Ref Range   Glucose-Capillary 212 (*) 70 - 99 mg/dL  GLUCOSE, CAPILLARY      Result Value Ref Range   Glucose-Capillary 272 (*) 70 - 99 mg/dL  GLUCOSE, CAPILLARY      Result Value Ref Range   Glucose-Capillary 181 (*) 70 - 99 mg/dL  GLUCOSE, CAPILLARY      Result Value Ref Range   Glucose-Capillary 230 (*) 70 - 99 mg/dL  GLUCOSE, CAPILLARY      Result Value Ref Range   Glucose-Capillary 167 (*) 70 - 99 mg/dL  CBC      Result Value Ref Range   WBC 5.6  4.0 - 10.5 K/uL   RBC 3.71 (*) 3.87 - 5.11 MIL/uL   Hemoglobin 10.6 (*) 12.0 - 15.0 g/dL   HCT 57.8 (*) 46.9 - 62.9 %   MCV  86.0  78.0 - 100.0 fL   MCH 28.6  26.0 - 34.0 pg   MCHC 33.2  30.0 - 36.0 g/dL   RDW 52.8  41.3 - 24.4 %   Platelets 262  150 - 400 K/uL  BASIC METABOLIC PANEL      Result Value Ref Range   Sodium 139  137 - 147 mEq/L   Potassium 3.8  3.7 - 5.3 mEq/L   Chloride 104  96 - 112 mEq/L   CO2 27  19 - 32 mEq/L   Glucose, Bld 190 (*) 70 - 99 mg/dL   BUN 9  6 - 23 mg/dL   Creatinine, Ser 0.10 (*) 0.50 -  1.10 mg/dL   Calcium 8.0 (*) 8.4 - 10.5 mg/dL   GFR calc non Af Amer >90  >90 mL/min   GFR calc Af Amer >90  >90 mL/min  VANCOMYCIN, TROUGH      Result Value Ref Range   Vancomycin Tr 19.9  10.0 - 20.0 ug/mL  GLUCOSE, CAPILLARY      Result Value Ref Range   Glucose-Capillary 189 (*) 70 - 99 mg/dL  GLUCOSE, CAPILLARY      Result Value Ref Range   Glucose-Capillary 130 (*) 70 - 99 mg/dL  GLUCOSE, CAPILLARY      Result Value Ref Range   Glucose-Capillary 197 (*) 70 - 99 mg/dL  TYPE AND SCREEN      Result Value Ref Range   ABO/RH(D) O POS     Antibody Screen NEG     Sample Expiration 10/16/2013     Dg Knee 1-2 Views Left  11/05/2013   CLINICAL DATA:  Left knee pain following a fall today.  EXAM: LEFT KNEE - 1-2 VIEW  COMPARISON:  09/25/2012.  FINDINGS: Interval left total knee prosthesis in satisfactory position and alignment. Proximal left fibular shaft fracture with thick bridging callus. Moderately large knee effusion. No acute fracture or dislocation. Arterial calcifications.  IMPRESSION: 1. Moderately large knee effusion. 2. No acute fracture or dislocation.   Electronically Signed   By: Gordan Payment M.D.   On: 11/05/2013 15:57      EKG Interpretation None      MDM   Final diagnoses:  Knee injury  Wound dehiscence    Patient discussed with Dr. Manus Gunning.  Dr. Darrelyn Hillock notified of the patients arrival.  Dr. Darrelyn Hillock is bedside.       Roxy Horseman, PA-C 11/05/13 1609

## 2013-11-05 NOTE — Progress Notes (Signed)
PHARMACIST - PHYSICIAN COMMUNICATION  CONCERNING: P&T Medication Policy Regarding Oral Bisphosphonates  RECOMMENDATION: Your order for alendronate (Fosamax), ibandronate (Boniva), or risedronate (Actonel) has been discontinued at this time.  If the patient's post-hospital medical condition warrants safe use of this class of drugs, please resume the pre-hospital regimen upon discharge.  DESCRIPTION:  Alendronate (Fosamax), ibandronate (Boniva), and risedronate (Actonel) can cause severe esophageal erosions in patients who are unable to remain upright at least 30 minutes after taking this medication.   Since brief interruptions in therapy are thought to have minimal impact on bone mineral density, the Pharmacy & Therapeutics Committee has established that bisphosphonate orders should be routinely discontinued during hospitalization.   To override this safety policy and permit administration of Boniva, Fosamax, or Actonel in the hospital, prescribers must write "DO NOT HOLD" in the comments section when placing the order for this class of medications.  Juliette Alcideustin Shequita Peplinski, PharmD, BCPS.   Pager: [phone] 11/05/2013 8:26 PM

## 2013-11-05 NOTE — H&P (Signed)
Chelsea Thornton is an 63 y.o. female.   Chief Complaint:" Larey SeatFell and opened my Knee Wound" HPI: She had a total knee in Dec.2014 and on March 6 had an evacuation of a "Post-Op" hematoma. She was DC on March 9,2015 and did well until she fell at [employer] today. Her knee was wrapped,Prior To, the fall and she had her clothing on at the time.  Past Medical History  Diagnosis Date  . Diabetes mellitus   . Hyperlipidemia   . Abnormal CXR 09/24/2012    Dr. Oneita Hurtlance"looks like little clouds"-was told is improved.Denies SOB or trouble breathing  . Heart attack     '12-"dizzy spells"  . Pneumonia yrs ago  . Complication of anesthesia 06-19-13 left shoulder and arm Thornton upon awakening and for several days  . PONV (postoperative nausea and vomiting)     Past Surgical History  Procedure Laterality Date  . Total knee arthroplasty  2007    Right  . Video bronchoscopy  11/03/2011    Procedure: VIDEO BRONCHOSCOPY WITHOUT FLUORO;  Surgeon: Chelsea ShareKeith M Clance, MD;  Location: [facility];  Service: Chelsea Thornton;  Laterality: Bilateral;  . Orif tibia plateau Left 09/25/2012    Procedure: OPEN REDUCTION INTERNAL FIXATION (ORIF) TIBIAL PLATEAU;  Surgeon: Chelsea Mcalpineobert Collins, MD;  Location: [facility];  Service: Orthopedics;  Laterality: Left;  . Cardiac catheterization      '12- stent placed-[facility]  . Toe surgery Left 06-16-13    Big toe and 2nd toe- tip removed -2 yrs ago, to clear infection  . Hardware removal Left 06/19/2013    Procedure: HARDWARE REMOVAL LEFT KNEE ;  Surgeon: Chelsea PalMatthew D Olin, MD;  Location: Chelsea [facility];  Service: Orthopedics;  Laterality: Left;  . Left foot surgery  2012    tip end of 2nd toe removed  . Total knee arthroplasty Left 08/07/2013    Procedure: LEFT TOTAL KNEE ARTHROPLASTY WITH REVISION COMPONENT ;  Surgeon: Chelsea PalMatthew D Olin, MD;  Location: [facility];  Service: Orthopedics;  Laterality: Left;  . Irrigation and debridement knee Left 10/13/2013    Procedure: INCISIONAL DEBRIDEMENT LEFT KNEE;   Surgeon: Chelsea PalMatthew D Olin, MD;  Location: [facility];  Service: Orthopedics;  Laterality: Left;    Family History  Problem Relation Age of Onset  . Heart attack Mother   . Diabetes Mother   . Cancer - Colon Father    Social History:  reports that she has never smoked. She has never used smokeless tobacco. She reports that she does not drink alcohol or use illicit drugs.  Allergies:  Allergies  Allergen Reactions  . Bee Venom Anaphylaxis  . Shrimp [Shellfish Allergy] Anaphylaxis    Throat swells hives  . Doxycycline Nausea And Vomiting  . Prednisone Other (See Comments)    Patient states that she can take this medication now     (Not in a hospital admission)  No results found for this or any previous visit (from the past 48 hour(s)). No results found.  Review of Systems  Constitutional: Negative.   HENT: Negative.   Eyes: Negative.   Respiratory: Negative.   Cardiovascular: Negative.   Gastrointestinal: Negative.   Genitourinary: Negative.   Musculoskeletal: Negative.        Wound dehisence left knee secondary to a fall.  Skin: Negative.   Neurological: Negative.   Endo/Heme/Allergies: Negative.   Psychiatric/Behavioral: Negative.     Blood pressure 171/63, pulse 102, temperature 97.8 F (36.6 C), temperature source Oral, resp. rate 16, SpO2 98.00%. Physical  Exam  Constitutional: She appears well-developed.  HENT:  Head: Normocephalic.  Eyes: Pupils are equal, round, and reactive to light.  Neck: Normal range of motion.  Cardiovascular: Normal rate.   Respiratory: Effort normal.  GI: Soft.  Musculoskeletal:  4-centimeter wound separation with Blood Clot ,clean and 2-Cm deep and 2-CM wide over the proximal,Anterior Tibial Tuberosity. Patellar Tendon is intact. She has good quad function.  Neurological: She is alert.  Skin:  Open wound as described.     Assessment/Plan Wound cleaned with Peroxide and Packed Open with a Betadine pressure dressing. Xrays ordered  .Will admit and maintain her Pre-Admission antibiotics.Xrays are fine,no post_Op Problems on Xray.  Ulus Hazen A 11/05/2013, 3:49 PM

## 2013-11-05 NOTE — ED Notes (Signed)
PA Rob at bedside speaking to pt. Pt wanting to leave. She is worried about missing work.

## 2013-11-05 NOTE — ED Notes (Signed)
Report given to Darl PikesSusan RN will transport after shift change

## 2013-11-06 ENCOUNTER — Inpatient Hospital Stay (HOSPITAL_COMMUNITY): Payer: Commercial Managed Care - PPO | Admitting: Registered Nurse

## 2013-11-06 ENCOUNTER — Encounter (HOSPITAL_COMMUNITY): Payer: Commercial Managed Care - PPO | Admitting: Registered Nurse

## 2013-11-06 ENCOUNTER — Encounter (HOSPITAL_COMMUNITY)
Admission: EM | Disposition: A | Discharge status: Home / Self Care | Payer: Self-pay | Source: Home / Self Care | Attending: Orthopedic Surgery

## 2013-11-06 ENCOUNTER — Encounter (HOSPITAL_COMMUNITY): Payer: Self-pay | Admitting: Registered Nurse

## 2013-11-06 HISTORY — PX: IRRIGATION AND DEBRIDEMENT KNEE: SHX5185

## 2013-11-06 LAB — COMPREHENSIVE METABOLIC PANEL
ALT: 11 U/L (ref 0–35)
AST: 16 U/L (ref 0–37)
Albumin: 3.4 g/dL — ABNORMAL LOW (ref 3.5–5.2)
Alkaline Phosphatase: 110 U/L (ref 39–117)
BUN: 13 mg/dL (ref 6–23)
CO2: 30 mEq/L (ref 19–32)
Calcium: 9.6 mg/dL (ref 8.4–10.5)
Chloride: 96 mEq/L (ref 96–112)
Creatinine, Ser: 0.46 mg/dL — ABNORMAL LOW (ref 0.50–1.10)
GFR calc Af Amer: >90 mL/min (ref >90)
GFR calc non Af Amer: >90 mL/min (ref >90)
Glucose, Bld: 379 mg/dL — ABNORMAL HIGH (ref 70–99)
Potassium: 4.1 mEq/L (ref 3.7–5.3)
Sodium: 136 mEq/L — ABNORMAL LOW (ref 137–147)
Total Bilirubin: 0.3 mg/dL (ref 0.3–1.2)
Total Protein: 6.4 g/dL (ref 6.0–8.3)

## 2013-11-06 LAB — CBC
HCT: 35.3 % — ABNORMAL LOW (ref 36.0–46.0)
Hemoglobin: 11.9 g/dL — ABNORMAL LOW (ref 12.0–15.0)
MCH: 28.5 pg (ref 26.0–34.0)
MCHC: 33.7 g/dL (ref 30.0–36.0)
MCV: 84.4 fL (ref 78.0–100.0)
Platelets: 266 10*3/uL (ref 150–400)
RBC: 4.18 MIL/uL (ref 3.87–5.11)
RDW: 12.9 % (ref 11.5–15.5)
WBC: 11.1 10*3/uL — ABNORMAL HIGH (ref 4.0–10.5)

## 2013-11-06 LAB — GLUCOSE, CAPILLARY
GLUCOSE-CAPILLARY: 224 mg/dL — AB (ref 70–99)
Glucose-Capillary: 227 mg/dL — ABNORMAL HIGH (ref 70–99)
Glucose-Capillary: 250 mg/dL — ABNORMAL HIGH (ref 70–99)

## 2013-11-06 LAB — PROTIME-INR
INR: 1.02 (ref 0.00–1.49)
Prothrombin Time: 13.2 seconds (ref 11.6–15.2)

## 2013-11-06 LAB — APTT: aPTT: 34 seconds (ref 24–37)

## 2013-11-06 SURGERY — IRRIGATION AND DEBRIDEMENT KNEE
Anesthesia: General | Site: Knee | Laterality: Left

## 2013-11-06 MED ORDER — EPHEDRINE SULFATE 50 MG/ML IJ SOLN
INTRAMUSCULAR | Status: AC
  Filled 2013-11-06: qty 1

## 2013-11-06 MED ORDER — PROPOFOL 10 MG/ML IV BOLUS
INTRAVENOUS | Status: DC | PRN
  Administered 2013-11-06: 180 mg via INTRAVENOUS

## 2013-11-06 MED ORDER — ONDANSETRON HCL 4 MG/2ML IJ SOLN
INTRAMUSCULAR | Status: AC
  Filled 2013-11-06: qty 2

## 2013-11-06 MED ORDER — LIDOCAINE HCL (CARDIAC) 20 MG/ML IV SOLN
INTRAVENOUS | Status: DC | PRN
  Administered 2013-11-06: 80 mg via INTRAVENOUS

## 2013-11-06 MED ORDER — MIDAZOLAM HCL 5 MG/5ML IJ SOLN
INTRAMUSCULAR | Status: DC | PRN
  Administered 2013-11-06: 1 mg via INTRAVENOUS

## 2013-11-06 MED ORDER — FENTANYL CITRATE 0.05 MG/ML IJ SOLN
INTRAMUSCULAR | Status: DC | PRN
  Administered 2013-11-06: 50 ug via INTRAVENOUS

## 2013-11-06 MED ORDER — LACTATED RINGERS IV SOLN
INTRAVENOUS | Status: DC
  Administered 2013-11-06: 1000 mL via INTRAVENOUS

## 2013-11-06 MED ORDER — MIDAZOLAM HCL 2 MG/2ML IJ SOLN
INTRAMUSCULAR | Status: AC
  Filled 2013-11-06: qty 2

## 2013-11-06 MED ORDER — FENTANYL CITRATE 0.05 MG/ML IJ SOLN
INTRAMUSCULAR | Status: AC
  Filled 2013-11-06: qty 5

## 2013-11-06 MED ORDER — LIDOCAINE HCL (CARDIAC) 20 MG/ML IV SOLN
INTRAVENOUS | Status: AC
  Filled 2013-11-06: qty 5

## 2013-11-06 MED ORDER — PROMETHAZINE HCL 25 MG/ML IJ SOLN: 6.2500 mg | INTRAMUSCULAR | Status: DC | PRN

## 2013-11-06 MED ORDER — OXYCODONE HCL 5 MG PO TABS: 5.0000 mg | ORAL_TABLET | Freq: Once | ORAL | Status: DC | PRN

## 2013-11-06 MED ORDER — OXYCODONE HCL 5 MG/5ML PO SOLN
5.0000 mg | Freq: Once | ORAL | Status: DC | PRN
  Filled 2013-11-06: qty 5

## 2013-11-06 MED ORDER — 0.9 % SODIUM CHLORIDE (POUR BTL) OPTIME
TOPICAL | Status: DC | PRN
  Administered 2013-11-06: 1000 mL

## 2013-11-06 MED ORDER — SODIUM CHLORIDE 0.9 % IR SOLN
Status: DC | PRN
  Administered 2013-11-06: 3000 mL

## 2013-11-06 MED ORDER — PHENYLEPHRINE 40 MCG/ML (10ML) SYRINGE FOR IV PUSH (FOR BLOOD PRESSURE SUPPORT)
PREFILLED_SYRINGE | INTRAVENOUS | Status: AC
  Filled 2013-11-06: qty 10

## 2013-11-06 MED ORDER — ONDANSETRON HCL 4 MG/2ML IJ SOLN
INTRAMUSCULAR | Status: DC | PRN
  Administered 2013-11-06: 4 mg via INTRAVENOUS

## 2013-11-06 MED ORDER — EPHEDRINE SULFATE 50 MG/ML IJ SOLN
INTRAMUSCULAR | Status: DC | PRN
  Administered 2013-11-06 (×3): 5 mg via INTRAVENOUS

## 2013-11-06 MED ORDER — SODIUM CHLORIDE 0.9 % IJ SOLN
INTRAMUSCULAR | Status: AC
  Filled 2013-11-06: qty 10

## 2013-11-06 MED ORDER — MEPERIDINE HCL 50 MG/ML IJ SOLN: 6.2500 mg | INTRAMUSCULAR | Status: DC | PRN

## 2013-11-06 MED ORDER — SODIUM CHLORIDE 0.9 % IJ SOLN
10.0000 mL | INTRAMUSCULAR | Status: DC | PRN
  Administered 2013-11-06: 10 mL

## 2013-11-06 MED ORDER — PHENYLEPHRINE HCL 10 MG/ML IJ SOLN
INTRAMUSCULAR | Status: DC | PRN
  Administered 2013-11-06: 40 ug via INTRAVENOUS

## 2013-11-06 MED ORDER — HYDROMORPHONE HCL PF 1 MG/ML IJ SOLN: 0.2500 mg | INTRAMUSCULAR | Status: DC | PRN

## 2013-11-06 MED ORDER — PROPOFOL 10 MG/ML IV BOLUS
INTRAVENOUS | Status: AC
  Filled 2013-11-06: qty 20

## 2013-11-06 SURGICAL SUPPLY — 53 items
ADH SKN CLS APL DERMABOND .7 (GAUZE/BANDAGES/DRESSINGS)
BAG SPEC THK2 15X12 ZIP CLS (MISCELLANEOUS)
BAG ZIPLOCK 12X15 (MISCELLANEOUS) ×1 IMPLANT
BANDAGE ELASTIC 6 VELCRO ST LF (GAUZE/BANDAGES/DRESSINGS) ×3 IMPLANT
BANDAGE ESMARK 6X9 LF (GAUZE/BANDAGES/DRESSINGS) ×1 IMPLANT
BNDG CMPR 9X6 STRL LF SNTH (GAUZE/BANDAGES/DRESSINGS)
BNDG ESMARK 6X9 LF (GAUZE/BANDAGES/DRESSINGS)
BNDG GAUZE ELAST 4 BULKY (GAUZE/BANDAGES/DRESSINGS) ×2 IMPLANT
CONT SPECI 4OZ STER CLIK (MISCELLANEOUS) ×1 IMPLANT
CUFF TOURN SGL QUICK 34 (TOURNIQUET CUFF)
CUFF TRNQT CYL 34X4X40X1 (TOURNIQUET CUFF) ×1 IMPLANT
DERMABOND ADVANCED (GAUZE/BANDAGES/DRESSINGS)
DERMABOND ADVANCED .7 DNX12 (GAUZE/BANDAGES/DRESSINGS) ×1 IMPLANT
DRAPE EXTREMITY T 121X128X90 (DRAPE) ×3 IMPLANT
DRSG ADAPTIC 3X8 NADH LF (GAUZE/BANDAGES/DRESSINGS) ×1 IMPLANT
DRSG AQUACEL AG ADV 3.5X10 (GAUZE/BANDAGES/DRESSINGS) ×1 IMPLANT
DRSG PAD ABDOMINAL 8X10 ST (GAUZE/BANDAGES/DRESSINGS) ×1 IMPLANT
DRSG TEGADERM 4X4.75 (GAUZE/BANDAGES/DRESSINGS) ×1 IMPLANT
DURAPREP 26ML APPLICATOR (WOUND CARE) ×1 IMPLANT
ELECT REM PT RETURN 9FT ADLT (ELECTROSURGICAL) ×3
ELECTRODE REM PT RTRN 9FT ADLT (ELECTROSURGICAL) ×1 IMPLANT
EVACUATOR 1/8 PVC DRAIN (DRAIN) ×3 IMPLANT
GAUZE SPONGE 2X2 8PLY STRL LF (GAUZE/BANDAGES/DRESSINGS) ×1 IMPLANT
GAUZE XEROFORM 5X9 LF (GAUZE/BANDAGES/DRESSINGS) ×2 IMPLANT
GLOVE BIOGEL PI IND STRL 7.5 (GLOVE) ×1 IMPLANT
GLOVE BIOGEL PI IND STRL 8 (GLOVE) ×1 IMPLANT
GLOVE BIOGEL PI INDICATOR 7.5 (GLOVE) ×2
GLOVE BIOGEL PI INDICATOR 8 (GLOVE) ×2
GLOVE ECLIPSE 8.0 STRL XLNG CF (GLOVE) IMPLANT
GLOVE ORTHO TXT STRL SZ7.5 (GLOVE) ×6 IMPLANT
GLOVE SURG ORTHO 8.0 STRL STRW (GLOVE) ×3 IMPLANT
GOWN SPEC L3 XXLG W/TWL (GOWN DISPOSABLE) ×6 IMPLANT
GOWN STRL REUS W/TWL LRG LVL3 (GOWN DISPOSABLE) ×3 IMPLANT
HANDPIECE INTERPULSE COAX TIP (DISPOSABLE) ×3
KIT BASIN OR (CUSTOM PROCEDURE TRAY) ×3 IMPLANT
MANIFOLD NEPTUNE II (INSTRUMENTS) ×3 IMPLANT
PACK TOTAL JOINT (CUSTOM PROCEDURE TRAY) ×3 IMPLANT
PAD ABD 8X10 STRL (GAUZE/BANDAGES/DRESSINGS) ×2 IMPLANT
PADDING CAST COTTON 6X4 STRL (CAST SUPPLIES) ×1 IMPLANT
POSITIONER SURGICAL ARM (MISCELLANEOUS) ×3 IMPLANT
SET HNDPC FAN SPRY TIP SCT (DISPOSABLE) ×1 IMPLANT
SPONGE GAUZE 2X2 STER 10/PKG (GAUZE/BANDAGES/DRESSINGS)
SPONGE GAUZE 4X4 12PLY (GAUZE/BANDAGES/DRESSINGS) ×3 IMPLANT
STAPLER VISISTAT 35W (STAPLE) ×1 IMPLANT
SUT ETHILON 2 0 PS N (SUTURE) ×4 IMPLANT
SUT MNCRL AB 4-0 PS2 18 (SUTURE) ×1 IMPLANT
SUT VIC AB 1 CT1 36 (SUTURE) ×2 IMPLANT
SUT VIC AB 2-0 CT1 27 (SUTURE) ×6
SUT VIC AB 2-0 CT1 TAPERPNT 27 (SUTURE) ×3 IMPLANT
SUT VLOC 180 0 24IN GS25 (SUTURE) ×1 IMPLANT
SWAB COLLECTION DEVICE MRSA (MISCELLANEOUS) ×3 IMPLANT
TOWEL OR 17X26 10 PK STRL BLUE (TOWEL DISPOSABLE) ×4 IMPLANT
TUBE ANAEROBIC SPECIMEN COL (MISCELLANEOUS) ×1 IMPLANT

## 2013-11-06 NOTE — Transfer of Care (Signed)
Immediate Anesthesia Transfer of Care Note  Patient: Chelsea Thornton  Procedure(s) Performed: Procedure(s): IRRIGATION, DEBRIDEMENT AND CLOSURE OF LEFT  KNEE (Left)  Patient Location: PACU  Anesthesia Type:General  Level of Consciousness: awake, alert , oriented and patient cooperative  Airway & Oxygen Therapy: Patient Spontanous Breathing and Patient connected to face mask oxygen  Post-op Assessment: Report given to PACU RN, Post -op Vital signs reviewed and stable and Patient moving all extremities  Post vital signs: Reviewed and stable  Complications: No apparent anesthesia complications

## 2013-11-06 NOTE — Progress Notes (Signed)
   CARE MANAGEMENT NOTE 11/06/2013  Patient:  Chelsea Thornton,Chelsea   Account Number:  [id number]  Date Initiated:  11/06/2013  Documentation initiated by:  [facility]Fuller Makin  Subjective/Objective Assessment:   Superficial postoperative wound infection     Action/Plan:   active [facility]   Anticipated DC Date:     Anticipated DC Plan:  HOME W HOME HEALTH SERVICES      DC Planning Services  CM consult      [facility]  Resumption Of Svcs/PTA Provider   Choice offered to / List presented to:  C-1 Patient        HH arranged  HH-2 PT  HH-1 RN  IV Antibiotics      HH agency  [facility]   Status of service:  Completed, signed off Medicare Important Message given?   (If response is "NO", the following Medicare IM given date fields will be blank) Date Medicare IM given:   Date Additional Medicare IM given:    Discharge Disposition:  HOME W HOME HEALTH SERVICES  Per UR Regulation:    If discussed at Long Length of Stay Meetings, dates discussed:    Comments:  11/06/2013 1100 NCM spoke to pt and [facility] She has DME at home. NCM notified [facility] of pt admit. Isidoro DonningAlesia Hatsumi Steinhart RN CCM Case Mgmt phone [phone]

## 2013-11-06 NOTE — Progress Notes (Signed)
Patient ID: Chelsea PrinceNancy Thornton, female   DOB: [birth year], 63 y.o.   MRN: [id number]  Seen and examined. Recent history reviewed from Admission with [name]  Left knee wound traumatic dehiscence To OR tonight for irrigation of wound with primary closure  Consent signed NPO

## 2013-11-06 NOTE — Progress Notes (Signed)
[facility]  Patient Status: Active (receiving services up to time of hospitalization)  [facility] is providing the following services: RN and Home IV abx  If patient discharges after hours, please call [phone].   Lanae CrumblyKristen Hayworth 11/06/2013, 2:56 PM

## 2013-11-06 NOTE — Anesthesia Preprocedure Evaluation (Addendum)
Anesthesia Evaluation  Patient identified by MRN, date of birth, ID band Patient awake    Reviewed: Allergy & Precautions, H&P , NPO status , Patient's Chart, lab work & pertinent test results  History of Anesthesia Complications (+) PONV and history of anesthetic complications  Airway Mallampati: II TM Distance: >3 FB Neck ROM: full    Dental no notable dental hx. (+) Teeth Intact, Dental Advisory Given   Pulmonary pneumonia -, resolved,  Pneumonia 2/14 breath sounds clear to auscultation  Pulmonary exam normal       Cardiovascular Exercise Tolerance: Good + Past MI and + Cardiac Stents Rhythm:regular Rate:Normal  MI and stent 2012   Neuro/Psych negative neurological ROS  negative psych ROS   GI/Hepatic negative GI ROS, Neg liver ROS,   Endo/Other  diabetes, Well Controlled, Type 2, Oral Hypoglycemic Agents  Renal/GU negative Renal ROS     Musculoskeletal   Abdominal   Peds  Hematology negative hematology ROS (+) anemia ,   Anesthesia Other Findings   Reproductive/Obstetrics negative OB ROS                          Anesthesia Physical  Anesthesia Plan  ASA: III  Anesthesia Plan: General   Post-op Pain Management:    Induction:   Airway Management Planned: LMA  Additional Equipment:   Intra-op Plan:   Post-operative Plan: Extubation in OR  Informed Consent: I have reviewed the patients History and Physical, chart, labs and discussed the procedure including the risks, benefits and alternatives for the proposed anesthesia with the patient or authorized representative who has indicated his/her understanding and acceptance.   Dental advisory given  Plan Discussed with: CRNA  Anesthesia Plan Comments:         Anesthesia Quick Evaluation

## 2013-11-06 NOTE — Preoperative (Signed)
Beta Blockers   Reason not to administer Beta Blockers:Not Applicable 

## 2013-11-06 NOTE — Progress Notes (Signed)
UR completed. Charlton Boule RN CCM Case Mgmt phone [phone] 

## 2013-11-06 NOTE — Progress Notes (Signed)
Inpatient Diabetes Program Recommendations  AACE/ADA: New Consensus Statement on Inpatient Glycemic Control (2013)  Target Ranges:  Prepandial:   less than 140 mg/dL      Peak postprandial:   less than 180 mg/dL (1-2 hours)      Critically ill patients:  140 - 180 mg/dL   Reason for Visit: No cbg's ordered with known history of DM2  Outpatient Diabetes medications:Amaryl 2 mg  Current orders for Inpatient glycemic control: Amaryl 2 mg QDAC   Pt here with wound dehiscence following surgery.  Please order cbg's q 4hrs if not yet eating; once eating, order tidwc and use sensitive correction tidwc.  Uncontrolled glucose can result in poor wound healing. HgbA1C would be helpful as well if glucose control has been a problem prior to and following surgery.  Thank you. Chelsea Thornton, RD, LDN, CDE Inpatient Diabetes Coordinator [phone]

## 2013-11-06 NOTE — Anesthesia Postprocedure Evaluation (Signed)
Anesthesia Post Note  Patient: Joella PrinceNancy Eakins  Procedure(s) Performed: Procedure(s) (LRB): IRRIGATION, DEBRIDEMENT AND CLOSURE OF LEFT  KNEE (Left)  Anesthesia type: General  Patient location: PACU  Post pain: Pain level controlled  Post assessment: Post-op Vital signs reviewed  Last Vitals: BP 158/66  Pulse 90  Temp(Src) 37 C (Oral)  Resp 16  Ht 5\' 7"  (1.702 m)  Wt 198 lb 6.6 oz (90 kg)  BMI 31.07 kg/m2  SpO2 99%  Post vital signs: Reviewed  Level of consciousness: sedated  Complications: No apparent anesthesia complications\

## 2013-11-07 ENCOUNTER — Encounter (HOSPITAL_COMMUNITY): Payer: Self-pay | Admitting: Orthopedic Surgery

## 2013-11-07 LAB — GLUCOSE, CAPILLARY
Glucose-Capillary: 185 mg/dL — ABNORMAL HIGH (ref 70–99)
Glucose-Capillary: 194 mg/dL — ABNORMAL HIGH (ref 70–99)

## 2013-11-07 MED ORDER — VANCOMYCIN HCL IN DEXTROSE 1-5 GM/200ML-% IV SOLN: 1000.0000 mg | Freq: Three times a day (TID) | INTRAVENOUS | Status: AC

## 2013-11-07 MED ORDER — HYDROCODONE-ACETAMINOPHEN 5-325 MG PO TABS: 1.0000 | ORAL_TABLET | ORAL | Status: AC | PRN

## 2013-11-07 MED ORDER — DSS 100 MG PO CAPS: 100.0000 mg | ORAL_CAPSULE | Freq: Two times a day (BID) | ORAL | Status: AC

## 2013-11-07 MED ORDER — ASPIRIN 325 MG PO TBEC: 325.0000 mg | DELAYED_RELEASE_TABLET | Freq: Two times a day (BID) | ORAL | Status: AC

## 2013-11-07 MED ORDER — POLYETHYLENE GLYCOL 3350 17 G PO PACK: 17.0000 g | PACK | Freq: Every day | ORAL | Status: AC | PRN

## 2013-11-07 MED ORDER — RIFAMPIN 300 MG PO CAPS: 300.0000 mg | ORAL_CAPSULE | Freq: Two times a day (BID) | ORAL | Status: AC

## 2013-11-07 MED ORDER — HEPARIN SOD (PORK) LOCK FLUSH 100 UNIT/ML IV SOLN
250.0000 [IU] | INTRAVENOUS | Status: AC | PRN
  Administered 2013-11-07: 250 [IU]

## 2013-11-07 NOTE — Brief Op Note (Signed)
11/05/2013 - 11/06/2013  8:57 AM  PATIENT:  Chelsea Thornton  63 y.o. female  PRE-OPERATIVE DIAGNOSIS:  left knee wound dehiscence  POST-OPERATIVE DIAGNOSIS:  left knee wound dehiscence  PROCEDURE:  Procedure(s): IRRIGATION, DEBRIDEMENT AND CLOSURE OF LEFT  KNEE (Left)  SURGEON:  Surgeon(s) and Role:    * Shelda PalMatthew D Gamaliel Charney, MD - Primary  PHYSICIAN ASSISTANT: None  ANESTHESIA:   general  EBL:   minimal  BLOOD ADMINISTERED:none  DRAINS: (1 medium) Hemovact drain(s) in the left knee superficially with  Suction Open   LOCAL MEDICATIONS USED:  NONE  SPECIMEN:  No Specimen  DISPOSITION OF SPECIMEN:  N/A  COUNTS:  YES  TOURNIQUET:  * No tourniquets in log *  DICTATION: .Other Dictation: Dictation Number [id number]  PLAN OF CARE: Admit to inpatient   PATIENT DISPOSITION:  PACU - hemodynamically stable.   Delay start of Pharmacological VTE agent (>24hrs) due to surgical blood loss or risk of bleeding: no

## 2013-11-07 NOTE — Progress Notes (Signed)
   Subjective: 1 Day Post-Op Procedure(s) (LRB): IRRIGATION, DEBRIDEMENT AND CLOSURE OF LEFT  KNEE (Left)   Patient reports pain as mild, pain controlled. No events throughout the night. Ready to be discharged home.   Objective:   VITALS:   Filed Vitals:   11/07/13 0730  BP: 160/74  Pulse: 83  Temp: 98.3 F (36.8 C)  Resp: 16    Neurovascular intact Dorsiflexion/Plantar flexion intact Incision: dressing C/D/I No cellulitis present Compartment soft  LABS  Recent Labs  11/05/13 1623 11/06/13 0522  HGB 12.3 11.9*  HCT 36.0 35.3*  WBC 9.2 11.1*  PLT 267 266     Recent Labs  11/05/13 1623 11/06/13 0522  NA 134* 136*  K 4.7 4.1  BUN 17 13  CREATININE 0.58 0.46*  GLUCOSE 487* 379*     Assessment/Plan: 1 Day Post-Op Procedure(s) (LRB): IRRIGATION, DEBRIDEMENT AND CLOSURE OF LEFT  KNEE (Left) Advance diet Up with therapy D/C IV fluids Discharge home with home health [facility].  Contact information:  [facility] [address] [place] [id number] [phone]    Expected ABLA  Treated with iron and will observe  Obese (BMI 30-39.9) Estimated body mass index is 31.07 kg/(m^2) as calculated from the following:   Height as of this encounter: 5\' 7"  (1.702 m).   Weight as of this encounter: 90 kg (198 lb 6.6 oz). Patient also counseled that weight may inhibit the healing process Patient counseled that losing weight will help with future health issues      Anastasio AuerbachMatthew S. Ashna Dorough   PAC  11/07/2013, 8:13 AM

## 2013-11-07 NOTE — Op Note (Signed)
NAMJoella Thornton:  Chelsea Thornton, Chelsea Thornton             ACCOUNT NO.:  [id number]  MEDICAL RECORD NO.:  [id number]  LOCATION:  1337                         FACILITY:  [facility]  PHYSICIAN:  Madlyn FrankelMatthew D. Charlann Boxerlin, M.D.  DATE OF BIRTH:  [birth year]  DATE OF PROCEDURE:  11/06/2013 DATE OF DISCHARGE:                              OPERATIVE REPORT   PREOPERATIVE DIAGNOSIS:  Left knee wound dehiscence from fall traumatically.  POSTOPERATIVE DIAGNOSIS:  Left knee wound dehiscence from fall traumatically.  PROCEDURE:  Irrigation and debridement, they are non-excisional debridement of left knee wound with primary wound closure.  SURGEON:  Madlyn FrankelMatthew D. Charlann Boxerlin, M.D.  ASSISTANT:  Surgical team.  ANESTHESIA:  General.  SPECIMENS:  None.  COMPLICATIONS:  None.  DRAINS:  One medium Hemovac.  TOURNIQUET:  Not utilized.  BLOOD LOSS:  Minimal.  INDICATION OF THE PROCEDURE:  Ms. Chelsea Thornton is a 63 year old female who is status post a total knee arthroplasty, who had some wound issues related to separate procedure prior to her knee replacement.  She had undergone a primary wound closure and debridement of subcutaneous hematoma.  She had been doing very well, seen in the office recently with healing of her wound, unfortunately had a fall at [employer].  Her wound had dehisced.  She was brought to the emergency room, was seen and evaluated by my partner, Dr. Worthy Rancheron [name], and admitted for my management the next day.  She was seen and evaluated and based on the appearance of the wound, I felt it was imperative based on the underlying knee arthroplasty that she have this addressed surgically.  Risks, benefits, and necessity of the procedure were reviewed as well as postoperative course and expectations, increased risk of infection.  PROCEDURE IN DETAIL:  The patient was brought to the operative theater. Once adequate anesthesia, preoperative antibiotics, Ancef administered, she was positioned supine with 10-15 draped around her mid  thigh. Her left lower extremity was then prepped and draped with Betadine scrub and paint due to the open wound.  Time-out was performed identifying the patient, planned procedure, and extremity.  At this point, I began initially with just the irrigation and non-excisional debridement using a pulse lavage and 3 L of normal saline solution and irrigating this wound out, there were no signs of infection, dirt, or contamination inside the wound.  Following this debridement and irrigation of the wound, I reapproximated the subcutaneous layer with 2-0 Vicryl and then used 2-0 nylon on the sutures.  I was able to get her skin to be reapproximated with an appropriate tension, not excessive.  Once the skin was closed, it was cleaned, dried, and dressed sterilely with Xeroform and a sterile wrap. She was then brought to the recovery room in stable condition.  Findings will be reviewed with her in the morning and as well with her family.     Madlyn FrankelMatthew D. Charlann Boxerlin, M.D.     MDO/MEDQ  D:  11/07/2013  T:  11/07/2013  Job:  [id number]

## 2013-11-07 NOTE — Progress Notes (Signed)
Pt to d/c home with a home health RN. No DME needs. AVS reviewed and "My Chart" discussed with pt. Pt capable of verbalizing medications, dressing changes, signs and symptoms of infection, and follow-up appointments. Remains hemodynamically stable. No signs and symptoms of distress. Educated pt to return to ER in the case of SOB, dizziness, or chest pain.

## 2013-11-07 NOTE — Discharge Summary (Signed)
Physician Discharge Summary  Patient ID: Chelsea Thornton MRN: [id number] DOB/AGE: [birth year] 62 y.o.  Admit date: 11/05/2013 Discharge date: 11/07/2013   Procedures:  Procedure(s) (LRB): IRRIGATION, DEBRIDEMENT AND CLOSURE OF LEFT  KNEE (Left)  Attending Physician:  Dr. Durene RomansMatthew Olin   Admission Diagnoses:   Larey SeatFell and opened the left TKA surgical incision   Discharge Diagnoses:  Active Problems:   Traumatic wound dehiscence  Past Medical History  Diagnosis Date  . Diabetes mellitus   . Hyperlipidemia   . Abnormal CXR 09/24/2012    Dr. Oneita Hurtlance"looks like little clouds"-was told is improved.Denies SOB or trouble breathing  . Heart attack     '12-"dizzy spells"  . Pneumonia yrs ago  . Complication of anesthesia 06-19-13 left shoulder and arm hurt upon awakening and for several days  . PONV (postoperative nausea and vomiting)     HPI:      She had a total knee in Dec.2014 and on March 6 had an evacuation of a "Post-Op" hematoma. She was DC on March 9,2015 and did well until she fell at [employer] on 11/05/2013.  She had pain in the knee and the previous incision split open. Do to her history she presented to the hospital and was admitted.  PCP: Feliciana RossettiGRISSO,GREG, MD   Discharged Condition: good  Hospital Course:  Patient was admitted on 11/05/2013.  She had an uneventful course in the hospital.  Dr. Charlann Boxerlin saw the patient on 11/06/2013.  It was decided to present to the OR to I&D and close the incision. Patient underwent the above stated procedure on 11/06/2013. Patient tolerated the procedure well and brought to the recovery room in good condition and subsequently to the floor.  POD #1 BP: 160/74 ; Pulse: 83 ; Temp: 98.3 F (36.8 C) ; Resp: 16  Patient reports pain as mild, pain controlled. No events throughout the night. Ready to be discharged home.  Neurovascular intact, dorsiflexion/plantar flexion intact, incision: dressing C/D/I, no cellulitis present and compartment soft.   LABS    Basename    HGB  11.9  HCT  35.3    Discharge Exam: General appearance: alert, cooperative and no distress Extremities: Homans sign is negative, no sign of DVT, no edema, redness or tenderness in the calves or thighs and no ulcers, gangrene or trophic changes  Disposition:     Home or Self Care with follow up in 2 weeks   Follow-up Information   Follow up with [name],[name], MD. Schedule an appointment as soon as possible for a visit on 11/09/2013.   Specialty:  Orthopedic Surgery   Contact information:   [address] [place] [address] [phone]       Discharge Orders   Future Orders Complete By Expires   Call MD / Call 911  As directed    Comments:     If you experience chest pain or shortness of breath, CALL 911 and be transported to the hospital emergency room.  If you develope a fever above 101 F, pus (white drainage) or increased drainage or redness at the wound, or calf pain, call your surgeon's office.   Constipation Prevention  As directed    Comments:     Drink plenty of fluids.  Prune juice may be helpful.  You may use a stool softener, such as Colace (over the counter) 100 mg twice a day.  Use MiraLax (over the counter) for constipation as needed.   Diet - low sodium heart healthy  As  directed    Discharge instructions  As directed    Comments:     Maintain surgical dressing until follow up in the clinic on Thursday. Call with any questions or concerns.   Increase activity slowly as tolerated  As directed    Weight bearing as tolerated  As directed    Questions:     Laterality:     Extremity:          Medication List         alendronate 70 MG tablet  Commonly known as:  FOSAMAX  Take 70 mg by mouth once a week. Take with a full glass of water on an empty stomach.     aspirin 325 MG EC tablet  Take 1 tablet (325 mg total) by mouth 2 (two) times daily.     CALCIUM 600 + D PO  Take 1 tablet by mouth 3 (three) times daily.      DSS 100 MG Caps  Take 100 mg by mouth 2 (two) times daily.     glimepiride 2 MG tablet  Commonly known as:  AMARYL  Take 2 mg by mouth daily with breakfast.     HYDROcodone-acetaminophen 5-325 MG per tablet  Commonly known as:  NORCO/VICODIN  Take 1-2 tablets by mouth every 4 (four) hours as needed for moderate pain.     polyethylene glycol packet  Commonly known as:  MIRALAX / GLYCOLAX  Take 17 g by mouth daily as needed for mild constipation.     rifampin 300 MG capsule  Commonly known as:  RIFADIN  Take 300 mg by mouth every 12 (twelve) hours. Started 10/15/13-11/09/2013     rifampin 300 MG capsule  Commonly known as:  RIFADIN  Take 1 capsule (300 mg total) by mouth every 12 (twelve) hours.     vancomycin 1 GM/200ML Soln  Commonly known as:  VANCOCIN  Inject 1,000 mg into the vein every 8 (eight) hours. 10/15/13- 11/09/13     vancomycin 1 GM/200ML Soln  Commonly known as:  VANCOCIN  Inject 200 mLs (1,000 mg total) into the vein every 8 (eight) hours.         Signed: Anastasio Auerbach. Serenitee Fuertes   PAC  11/07/2013, 5:19 PM

## 2013-11-09 NOTE — Progress Notes (Signed)
Discharge summary sent to payer through [facility]  

## 2014-07-28 IMAGING — RF DG KNEE 3 VIEWS*L*
1 series · 2 of 2 positions shown · non-contrast
Comparison: 09/19/2012

CLINICAL DATA: Left tibial plateau fracture, ORIF.

LEFT KNEE - 3 VIEW

[Series 1: run · 2 of 2 slices shown]
[im 1/2]
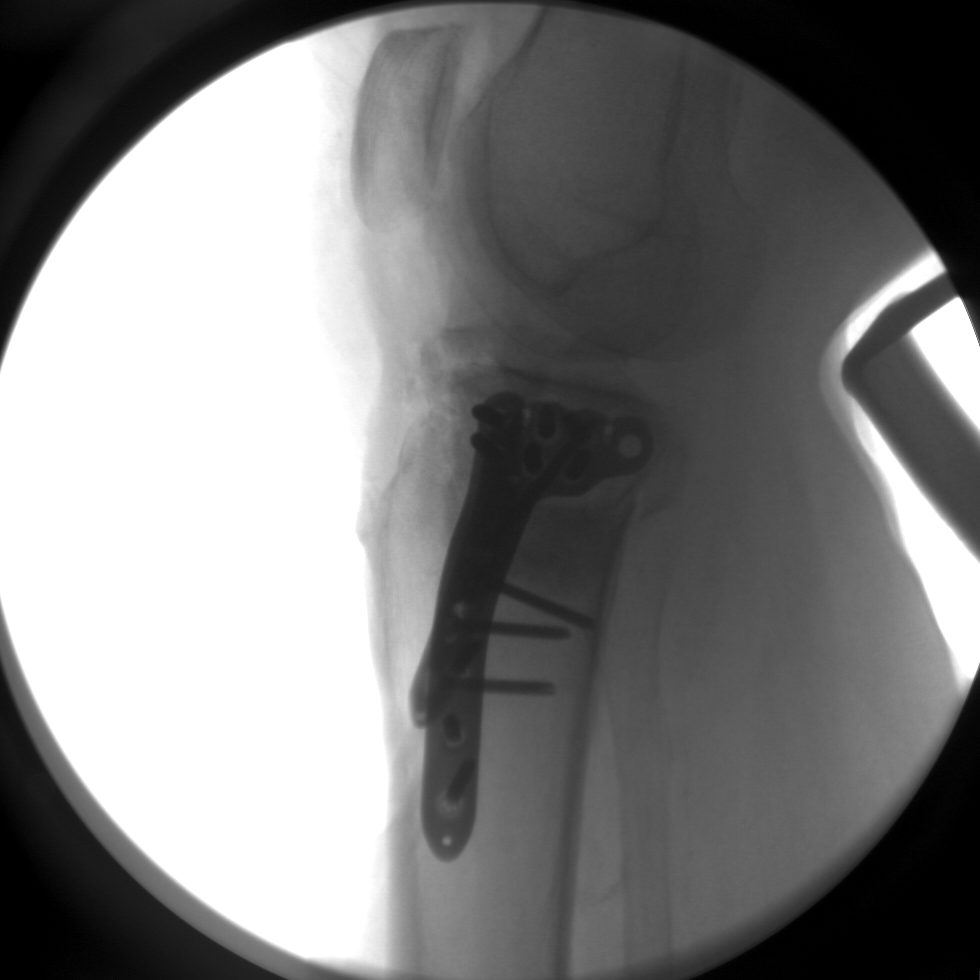
[im 2/2]
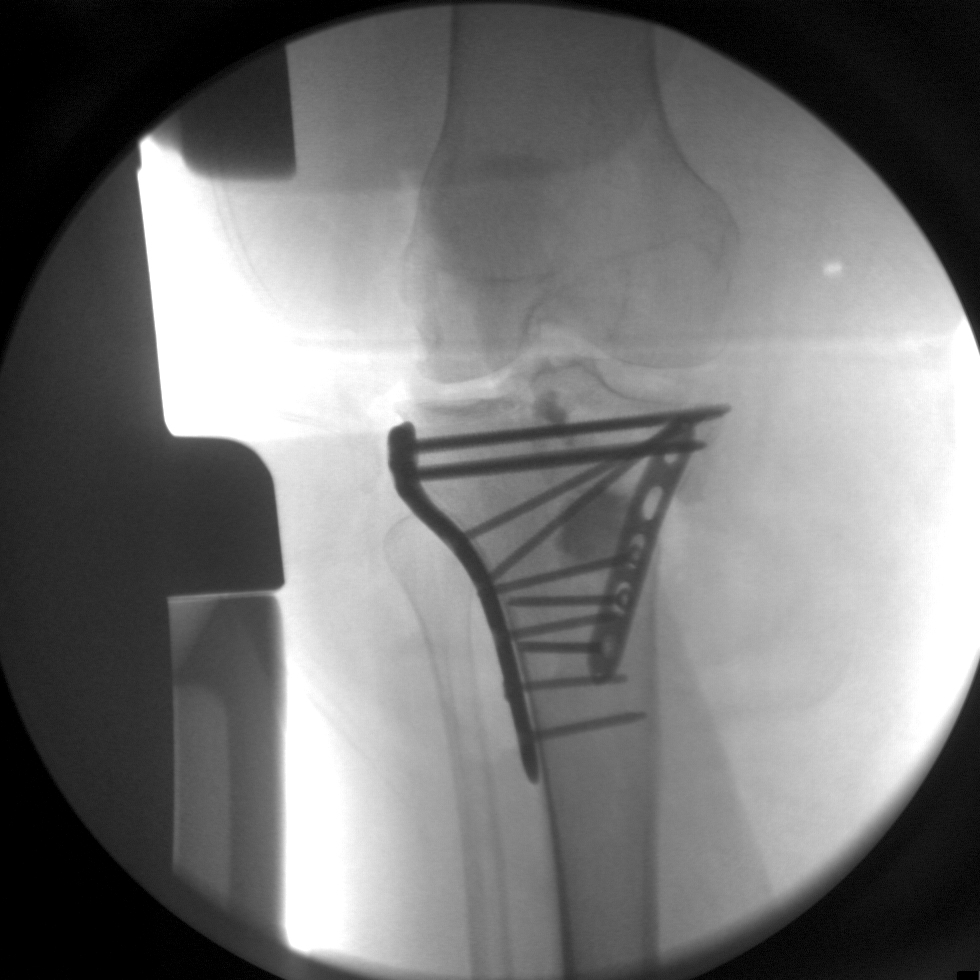

[2 of 2 positions shown; findings below may reference images not displayed]

FINDINGS: Two intraoperative spot images demonstrate plate and
screw fixation of the proximal tibial fracture.  Near anatomic
alignment.  No hardware or bony complicating feature.
IMPRESSION: Internal fixation of the proximal tibial fracture.

## 2015-01-17 ENCOUNTER — Other Ambulatory Visit: Payer: Self-pay

## 2015-06-28 ENCOUNTER — Other Ambulatory Visit: Payer: Self-pay

## 2015-09-07 IMAGING — CR DG KNEE 1-2V*L*
2 series · 2 of 2 positions shown · non-contrast
Comparison: 09/25/2012.

CLINICAL DATA: Left knee pain following a fall today.

EXAM:
LEFT KNEE - 1-2 VIEW

[x knee ap left]
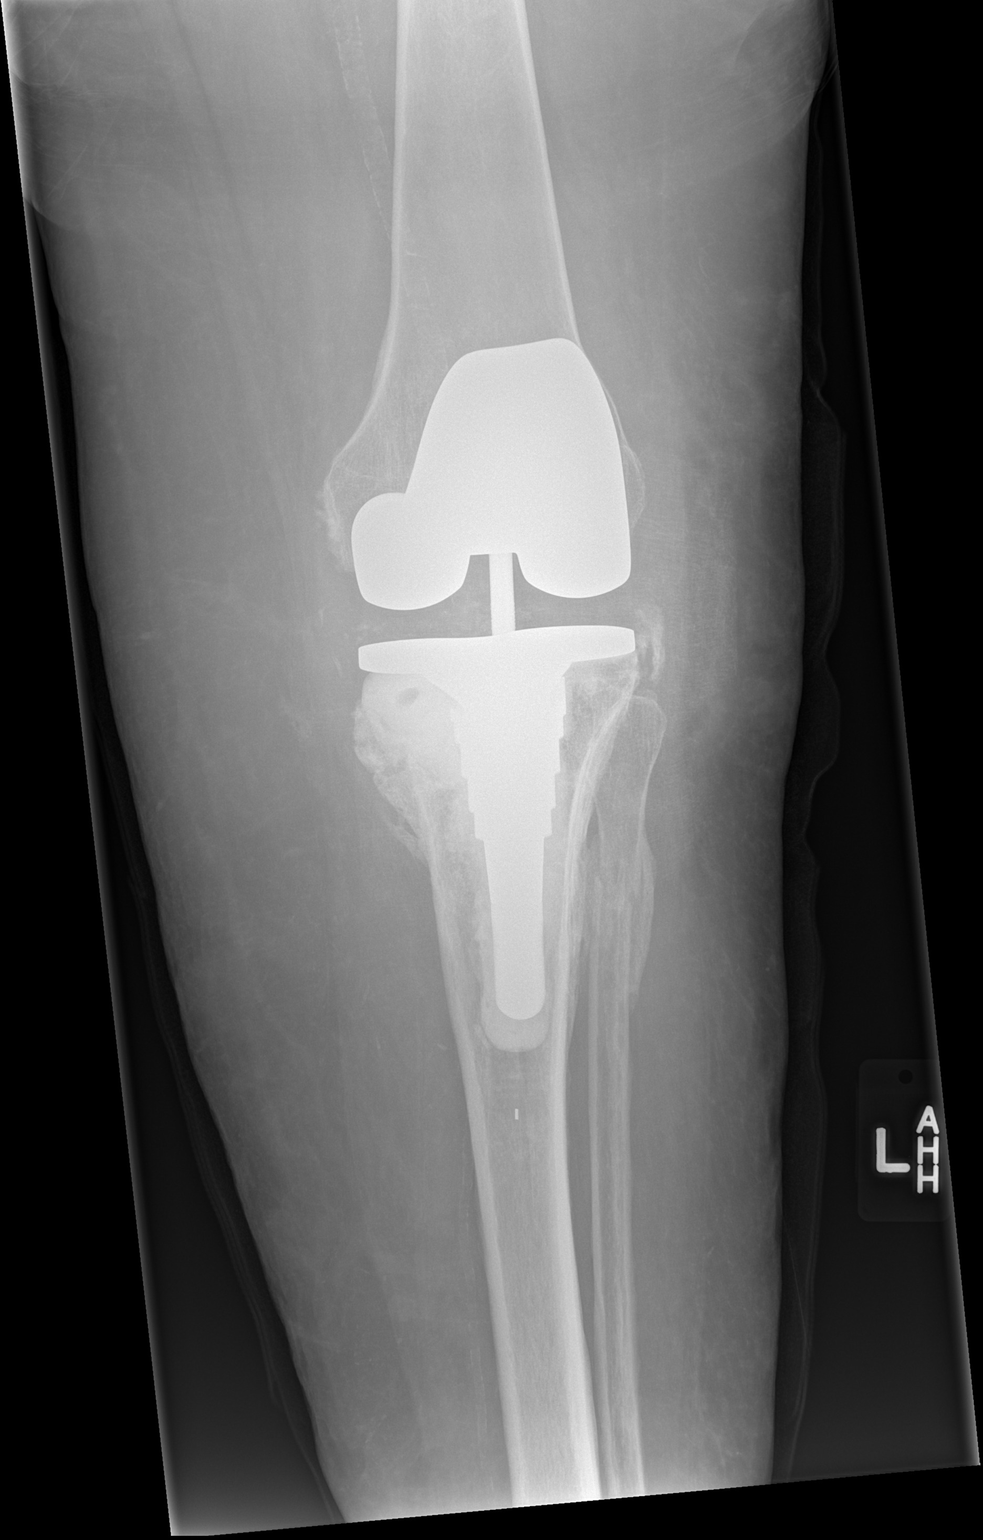

[x knee lat left]
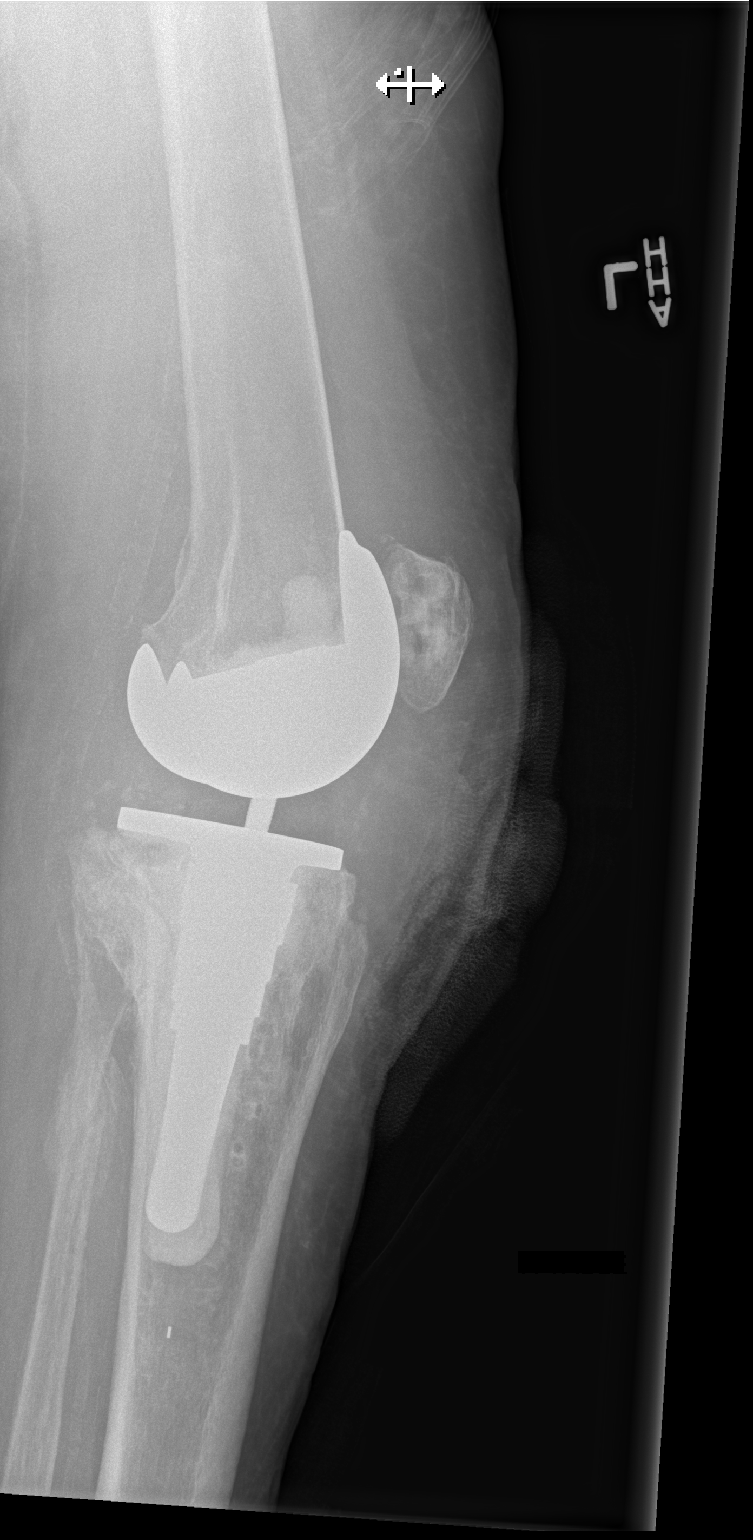

[2 of 2 positions shown; findings below may reference images not displayed]

FINDINGS: Interval left total knee prosthesis in satisfactory position and
alignment. Proximal left fibular shaft fracture with thick bridging
callus. Moderately large knee effusion. No acute fracture or
dislocation. Arterial calcifications.
IMPRESSION: 1. Moderately large knee effusion.
2. No acute fracture or dislocation.

## 2018-10-11 DIAGNOSIS — K219 Gastro-esophageal reflux disease without esophagitis: Secondary | ICD-10-CM

## 2018-10-11 DIAGNOSIS — E11621 Type 2 diabetes mellitus with foot ulcer: Secondary | ICD-10-CM

## 2018-10-11 DIAGNOSIS — I10 Essential (primary) hypertension: Secondary | ICD-10-CM

## 2018-10-11 DIAGNOSIS — N189 Chronic kidney disease, unspecified: Secondary | ICD-10-CM

## 2018-10-11 DIAGNOSIS — I252 Old myocardial infarction: Secondary | ICD-10-CM

## 2018-10-11 DIAGNOSIS — E119 Type 2 diabetes mellitus without complications: Secondary | ICD-10-CM | POA: Diagnosis not present

## 2018-10-11 DIAGNOSIS — L03116 Cellulitis of left lower limb: Secondary | ICD-10-CM

## 2018-10-11 DIAGNOSIS — E785 Hyperlipidemia, unspecified: Secondary | ICD-10-CM | POA: Diagnosis not present

## 2018-10-12 DIAGNOSIS — E785 Hyperlipidemia, unspecified: Secondary | ICD-10-CM | POA: Diagnosis not present

## 2018-10-12 DIAGNOSIS — E119 Type 2 diabetes mellitus without complications: Secondary | ICD-10-CM | POA: Diagnosis not present

## 2018-10-12 DIAGNOSIS — I10 Essential (primary) hypertension: Secondary | ICD-10-CM | POA: Diagnosis not present

## 2018-10-12 DIAGNOSIS — N189 Chronic kidney disease, unspecified: Secondary | ICD-10-CM | POA: Diagnosis not present

## 2018-10-13 DIAGNOSIS — N189 Chronic kidney disease, unspecified: Secondary | ICD-10-CM | POA: Diagnosis not present

## 2018-10-13 DIAGNOSIS — E785 Hyperlipidemia, unspecified: Secondary | ICD-10-CM | POA: Diagnosis not present

## 2018-10-13 DIAGNOSIS — I10 Essential (primary) hypertension: Secondary | ICD-10-CM | POA: Diagnosis not present

## 2018-10-13 DIAGNOSIS — E119 Type 2 diabetes mellitus without complications: Secondary | ICD-10-CM | POA: Diagnosis not present

## 2018-10-14 DIAGNOSIS — N189 Chronic kidney disease, unspecified: Secondary | ICD-10-CM | POA: Diagnosis not present

## 2018-10-14 DIAGNOSIS — E119 Type 2 diabetes mellitus without complications: Secondary | ICD-10-CM | POA: Diagnosis not present

## 2018-10-14 DIAGNOSIS — I10 Essential (primary) hypertension: Secondary | ICD-10-CM | POA: Diagnosis not present

## 2018-10-14 DIAGNOSIS — E785 Hyperlipidemia, unspecified: Secondary | ICD-10-CM | POA: Diagnosis not present

## 2019-01-27 ENCOUNTER — Inpatient Hospital Stay
Admission: AD | Admit: 2019-01-27 | Payer: Commercial Managed Care - PPO | Source: Other Acute Inpatient Hospital | Admitting: Emergency Medicine

## 2019-02-08 DEATH — deceased
